# Patient Record
Sex: Male | Born: 1995 | Race: Black or African American | Hispanic: No | Marital: Single
Health system: Southern US, Community
[De-identification: ages and names within clinical notes are randomized; demographics above are authoritative.]

## PROBLEM LIST (undated history)

## (undated) DIAGNOSIS — J302 Other seasonal allergic rhinitis: Secondary | ICD-10-CM

## (undated) HISTORY — DX: Other seasonal allergic rhinitis: J30.2

---

## 2016-05-17 ENCOUNTER — Ambulatory Visit: Payer: Self-pay | Attending: Internal Medicine

## 2016-06-26 ENCOUNTER — Encounter (HOSPITAL_COMMUNITY): Payer: Self-pay

## 2016-06-26 ENCOUNTER — Emergency Department (HOSPITAL_COMMUNITY)
Admission: EM | Admit: 2016-06-26 | Discharge: 2016-06-26 | Disposition: A | Discharge status: Home / Self Care | Payer: Self-pay | Attending: Emergency Medicine | Admitting: Emergency Medicine

## 2016-06-26 ENCOUNTER — Emergency Department (HOSPITAL_COMMUNITY): Payer: Self-pay

## 2016-06-26 DIAGNOSIS — Y999 Unspecified external cause status: Secondary | ICD-10-CM | POA: Insufficient documentation

## 2016-06-26 DIAGNOSIS — S60221A Contusion of right hand, initial encounter: Secondary | ICD-10-CM | POA: Insufficient documentation

## 2016-06-26 DIAGNOSIS — Y9389 Activity, other specified: Secondary | ICD-10-CM | POA: Insufficient documentation

## 2016-06-26 DIAGNOSIS — W2201XA Walked into wall, initial encounter: Secondary | ICD-10-CM | POA: Insufficient documentation

## 2016-06-26 DIAGNOSIS — Y92009 Unspecified place in unspecified non-institutional (private) residence as the place of occurrence of the external cause: Secondary | ICD-10-CM | POA: Insufficient documentation

## 2016-06-26 MED ORDER — IBUPROFEN 800 MG PO TABS: 800.0000 mg | ORAL_TABLET | Freq: Three times a day (TID) | ORAL | Status: DC

## 2016-06-26 MED ORDER — IBUPROFEN 800 MG PO TABS
800.0000 mg | ORAL_TABLET | Freq: Once | ORAL | Status: AC
  Administered 2016-06-26: 800 mg via ORAL
  Filled 2016-06-26: qty 1

## 2016-06-26 NOTE — Discharge Instructions (Signed)
1. Medications: alternate ibuprofen and tylenol for pain control, usual home medications 2. Treatment: rest, ice, elevate and use brace, drink plenty of fluids, gentle stretching 3. Follow Up: Please followup with orthopedics as directed or your PCP in 1 week if no improvement for discussion of your diagnoses and further evaluation after today's visit; if you do not have a primary care doctor use the resource guide provided to find one; Please return to the ER for worsening symptoms or other concerns    Cryotherapy Cryotherapy means treatment with cold. Ice or gel packs can be used to reduce both pain and swelling. Ice is the most helpful within the first 24 to 48 hours after an injury or flare-up from overusing a muscle or joint. Sprains, strains, spasms, burning pain, shooting pain, and aches can all be eased with ice. Ice can also be used when recovering from surgery. Ice is effective, has very few side effects, and is safe for most people to use. PRECAUTIONS  Ice is not a safe treatment option for people with:  Raynaud phenomenon. This is a condition affecting small blood vessels in the extremities. Exposure to cold may cause your problems to return.  Cold hypersensitivity. There are many forms of cold hypersensitivity, including:  Cold urticaria. Red, itchy hives appear on the skin when the tissues begin to warm after being iced.  Cold erythema. This is a red, itchy rash caused by exposure to cold.  Cold hemoglobinuria. Red blood cells break down when the tissues begin to warm after being iced. The hemoglobin that carry oxygen are passed into the urine because they cannot combine with blood proteins fast enough.  Numbness or altered sensitivity in the area being iced. If you have any of the following conditions, do not use ice until you have discussed cryotherapy with your caregiver:  Heart conditions, such as arrhythmia, angina, or chronic heart disease.  High blood  pressure.  Healing wounds or open skin in the area being iced.  Current infections.  Rheumatoid arthritis.  Poor circulation.  Diabetes. Ice slows the blood flow in the region it is applied. This is beneficial when trying to stop inflamed tissues from spreading irritating chemicals to surrounding tissues. However, if you expose your skin to cold temperatures for too long or without the proper protection, you can damage your skin or nerves. Watch for signs of skin damage due to cold. HOME CARE INSTRUCTIONS Follow these tips to use ice and cold packs safely.  Place a dry or damp towel between the ice and skin. A damp towel will cool the skin more quickly, so you may need to shorten the time that the ice is used.  For a more rapid response, add gentle compression to the ice.  Ice for no more than 10 to 20 minutes at a time. The bonier the area you are icing, the less time it will take to get the benefits of ice.  Check your skin after 5 minutes to make sure there are no signs of a poor response to cold or skin damage.  Rest 20 minutes or more between uses.  Once your skin is numb, you can end your treatment. You can test numbness by very lightly touching your skin. The touch should be so light that you do not see the skin dimple from the pressure of your fingertip. When using ice, most people will feel these normal sensations in this order: cold, burning, aching, and numbness.  Do not use ice on someone who  cannot communicate their responses to pain, such as small children or people with dementia. HOW TO MAKE AN ICE PACK Ice packs are the most common way to use ice therapy. Other methods include ice massage, ice baths, and cryosprays. Muscle creams that cause a cold, tingly feeling do not offer the same benefits that ice offers and should not be used as a substitute unless recommended by your caregiver. To make an ice pack, do one of the following:  Place crushed ice or a bag of frozen  vegetables in a sealable plastic bag. Squeeze out the excess air. Place this bag inside another plastic bag. Slide the bag into a pillowcase or place a damp towel between your skin and the bag.  Mix 3 parts water with 1 part rubbing alcohol. Freeze the mixture in a sealable plastic bag. When you remove the mixture from the freezer, it will be slushy. Squeeze out the excess air. Place this bag inside another plastic bag. Slide the bag into a pillowcase or place a damp towel between your skin and the bag. SEEK MEDICAL CARE IF:  You develop white spots on your skin. This may give the skin a blotchy (mottled) appearance.  Your skin turns blue or pale.  Your skin becomes waxy or hard.  Your swelling gets worse. MAKE SURE YOU:   Understand these instructions.  Will watch your condition.  Will get help right away if you are not doing well or get worse.   This information is not intended to replace advice given to you by your health care provider. Make sure you discuss any questions you have with your health care provider.   Document Released: 07/25/2011 Document Revised: 12/19/2014 Document Reviewed: 07/25/2011 Elsevier Interactive Patient Education 2016 Elsevier Inc.    Hand Contusion A hand contusion is a deep bruise on your hand area. Contusions are the result of an injury that caused bleeding under the skin. The contusion may turn blue, purple, or yellow. Minor injuries will give you a painless contusion, but more severe contusions may stay painful and swollen for a few weeks. CAUSES  A contusion is usually caused by a blow, trauma, or direct force to an area of the body. SYMPTOMS   Swelling and redness of the injured area.  Discoloration of the injured area.  Tenderness and soreness of the injured area.  Pain. DIAGNOSIS  The diagnosis can be made by taking a history and performing a physical exam. An X-ray, CT scan, or MRI may be needed to determine if there were any  associated injuries, such as broken bones (fractures). TREATMENT  Often, the best treatment for a hand contusion is resting, elevating, icing, and applying cold compresses to the injured area. Over-the-counter medicines may also be recommended for pain control. HOME CARE INSTRUCTIONS   Put ice on the injured area.  Put ice in a plastic bag.  Place a towel between your skin and the bag.  Leave the ice on for 15-20 minutes, 03-04 times a day.  Only take over-the-counter or prescription medicines as directed by your caregiver. Your caregiver may recommend avoiding anti-inflammatory medicines (aspirin, ibuprofen, and naproxen) for 48 hours because these medicines may increase bruising.  If told, use an elastic wrap as directed. This can help reduce swelling. You may remove the wrap for sleeping, showering, and bathing. If your fingers become numb, cold, or blue, take the wrap off and reapply it more loosely.  Elevate your hand with pillows to reduce swelling.  Avoid overusing  your hand if it is painful. SEEK IMMEDIATE MEDICAL CARE IF:   You have increased redness, swelling, or pain in your hand.  Your swelling or pain is not relieved with medicines.  You have loss of feeling in your hand or are unable to move your fingers.  Your hand turns cold or blue.  You have pain when you move your fingers.  Your hand becomes warm to the touch.  Your contusion does not improve in 2 days. MAKE SURE YOU:   Understand these instructions.  Will watch your condition.  Will get help right away if you are not doing well or get worse.   This information is not intended to replace advice given to you by your health care provider. Make sure you discuss any questions you have with your health care provider.   Document Released: 05/20/2002 Document Revised: 08/22/2012 Document Reviewed: 05/21/2012 Elsevier Interactive Patient Education [employer]2016 Elsevier Inc.

## 2016-06-26 NOTE — ED Provider Notes (Signed)
CSN: [id number]     Arrival date & time 06/26/16  0009 History  By signing my name below, I, Steven Norman, attest that this documentation has been prepared under the direction and in the presence of Steven Ellis Mehaffey Steven Norman.   Electronically Signed: Vista Minkobert Norman, ED Scribe. 06/26/2016. 1:16 AM.   Chief Complaint  Patient presents with  . Hand Injury    HPI HPI Comments: Steven Norman is a 20 y.o. male who presents to the Emergency Department complaining of an injury to his right hand that occurred around noon yesterday. Pt reports constant, unchanged, throbbing pain to the hand. Pt states he punched a drywall inside of a house. Pt reports pain exacerbated by making a fist with the hand. Pt has not taken any medication for pain but has applied ice with minor relief. Pt denies any numbness or tingling in the hand. Pt further denies any pain to his right elbow.  Pt denies weakness or dropping things from the hand.  History reviewed. No pertinent past medical history. History reviewed. No pertinent past surgical history. History reviewed. No pertinent family history. Social History  Substance Use Topics  . Smoking status: Never Smoker   . Smokeless tobacco: None  . Alcohol Use: No    Review of Systems  Constitutional: Negative for fever and chills.  Gastrointestinal: Negative for nausea and vomiting.  Musculoskeletal: Positive for joint swelling and arthralgias (right hand). Negative for back pain, neck pain and neck stiffness.  Skin: Negative for wound.  Neurological: Negative for numbness.  Hematological: Does not bruise/bleed easily.  Psychiatric/Behavioral: The patient is not nervous/anxious.   All other systems reviewed and are negative.     Allergies  Review of patient's allergies indicates no known allergies.  Home Medications   Prior to Admission medications   Medication Sig Start Date End Date Taking? Authorizing Provider  ibuprofen (ADVIL,MOTRIN) 800 MG tablet Take 1  tablet (800 mg total) by mouth 3 (three) times daily. 06/26/16   Steven Siegel, Steven Norman   BP 120/63 mmHg  Pulse 81  Temp(Src) 97.6 F (36.4 C) (Oral)  Resp 18  SpO2 100% Physical Exam  Constitutional: He appears well-developed and well-nourished. No distress.  HENT:  Head: Normocephalic and atraumatic.  Eyes: Conjunctivae are normal.  Neck: Normal range of motion.  Cardiovascular: Normal rate, regular rhythm and intact distal pulses.   Capillary refill < 3 sec  Pulmonary/Chest: Effort normal and breath sounds normal.  Musculoskeletal: He exhibits tenderness. He exhibits no edema.  Right hand: Ecchymosis and mild swelling to the dorsum of the right hand. Somewhat limited range of motion of all fingers of the right hand and right wrist due to pain and poor effort. Sensation intact all fingers and entirety of the hand. Grip strength 4/5 due to pain and poor effort.  Full range of motion of the right elbow  Neurological: He is alert. Coordination normal.  Skin: Skin is warm and dry. No rash noted. He is not diaphoretic.  No tenting of the skin  Psychiatric: He has a normal mood and affect.  Nursing note and vitals reviewed.   ED Course  Procedures  DIAGNOSTIC STUDIES: Oxygen Saturation is 100% on RA, normal by my interpretation.  COORDINATION OF CARE: 1:16 AM-Will order imaging. Discussed treatment plan with pt at bedside and pt agreed to plan.    Imaging Review Dg Hand Complete Right  06/26/2016  CLINICAL DATA:  20 year old male with right hand pain. EXAM: RIGHT HAND - COMPLETE 3+ VIEW COMPARISON:  None. FINDINGS: There is no evidence of fracture or dislocation. There is no evidence of arthropathy or other focal bone abnormality. Soft tissues are unremarkable. IMPRESSION: Negative. Electronically Signed   By: Elgie Collard M.D.   On: 06/26/2016 00:41   I have personally reviewed and evaluated these images and lab results as part of my medical decision-making.    MDM    Final diagnoses:  Contusion of right hand, initial encounter  Steven Norman presents with right hand pain after punching a wall yesterday.  Patient X-Ray negative for obvious fracture or dislocation. Pain managed in ED. Pt advised to follow up with PCP if symptoms persist. Patient given brace while in ED, conservative therapy recommended and discussed. Patient will be dc home & is agreeable with above plan.  I personally performed the services described in this documentation, which was scribed in my presence. The recorded information has been reviewed and is accurate.   Dahlia Client Kassondra Geil, Steven Norman 06/26/16 0155  Lyndal Pulley, MD 06/26/16 [id number]

## 2016-06-26 NOTE — ED Notes (Signed)
Pt ambulated to XR. Declined ice pack.

## 2016-06-26 NOTE — ED Notes (Addendum)
Pt c/o R hand pain. Pt states that he punched a wall yesterday. Normal color. R hand is swollen and pt is unable to make a fist. A&Ox4.

## 2016-06-27 MED FILL — IBUPROFEN 800 MG TABLET: 800 | 7 days supply | Qty: 21 | Fill #0

## 2017-08-23 ENCOUNTER — Ambulatory Visit: Payer: Self-pay | Attending: Family Medicine | Admitting: Licensed Clinical Social Worker

## 2017-08-23 ENCOUNTER — Encounter: Payer: Self-pay | Admitting: Family Medicine

## 2017-08-23 ENCOUNTER — Ambulatory Visit: Payer: Self-pay | Attending: Family Medicine | Admitting: Family Medicine

## 2017-08-23 ENCOUNTER — Other Ambulatory Visit: Payer: Self-pay

## 2017-08-23 VITALS — BP 101/63 | HR 54 | Temp 98.6°F | Resp 18 | Ht 68.0 in | Wt 124.6 lb

## 2017-08-23 DIAGNOSIS — F4321 Adjustment disorder with depressed mood: Secondary | ICD-10-CM

## 2017-08-23 DIAGNOSIS — Z634 Disappearance and death of family member: Secondary | ICD-10-CM

## 2017-08-23 DIAGNOSIS — I491 Atrial premature depolarization: Secondary | ICD-10-CM

## 2017-08-23 DIAGNOSIS — R011 Cardiac murmur, unspecified: Secondary | ICD-10-CM | POA: Insufficient documentation

## 2017-08-23 DIAGNOSIS — R9431 Abnormal electrocardiogram [ECG] [EKG]: Secondary | ICD-10-CM

## 2017-08-23 DIAGNOSIS — F4329 Adjustment disorder with other symptoms: Secondary | ICD-10-CM

## 2017-08-23 DIAGNOSIS — Z0000 Encounter for general adult medical examination without abnormal findings: Secondary | ICD-10-CM

## 2017-08-23 DIAGNOSIS — F4323 Adjustment disorder with mixed anxiety and depressed mood: Secondary | ICD-10-CM

## 2017-08-23 MED ORDER — CITALOPRAM HYDROBROMIDE 10 MG PO TABS: 10.0000 mg | ORAL_TABLET | Freq: Every day | ORAL | 1 refills | Status: AC

## 2017-08-23 NOTE — Patient Instructions (Signed)

## 2017-08-23 NOTE — Progress Notes (Signed)
Subjective:   Patient ID: Steven Norman, male    DOB: [birth year], 21 y.o.   MRN: [id number]  Chief Complaint  Patient presents with  . Establish Care   HPI Steven Norman 21 y.o. male presents for comprehensive physical examination. Patient denies chest pain, chest pressure/discomfort, claudication, dyspnea, fatigue, lower extremity edema, near-syncope, palpitations and syncope.   He denies hematemesis, melena and unexpected weight loss.He complains of depressed mood, difficulty concentrating and excessive worry. Onset was approximately 7 months ago, unchanged since that time.  He denies current suicidal and homicidal plan or intent.   Possible organic causes contributing are: none.  Risk factors: negative life event reports his older brother died in a shooting  7 months ago. Previous treatment includes none . He is agreeable to speaking with LCSW at this time, he reports interest in medication to help manage symptoms.   Past Medical History:  Diagnosis Date  . Seasonal allergies     History reviewed. No pertinent surgical history.  Family History  Problem Relation Age of Onset  . Cancer Maternal Aunt     Social History   Social History  . Marital status: Single    Spouse name: N/A  . Number of children: N/A  . Years of education: N/A   Occupational History  . Not on file.   Social History Main Topics  . Smoking status: Never Smoker  . Smokeless tobacco: Never Used  . Alcohol use No  . Drug use: Unknown  . Sexual activity: Not on file   Other Topics Concern  . Not on file   Social History Narrative  . No narrative on file    Outpatient Medications Prior to Visit  Medication Sig Dispense Refill  . ibuprofen (ADVIL,MOTRIN) 800 MG tablet Take 1 tablet (800 mg total) by mouth 3 (three) times daily. (Patient not taking: Reported on 08/23/2017) 21 tablet 0   No facility-administered medications prior to visit.     No Known Allergies  Review of Systems    Constitutional: Negative.   HENT: Negative.   Eyes: Negative.   Respiratory: Negative.   Cardiovascular: Negative.   Gastrointestinal: Negative.   Genitourinary: Negative.   Musculoskeletal: Negative.   Skin: Negative.   Neurological: Negative.   Endo/Heme/Allergies: Negative.   Psychiatric/Behavioral: Positive for depression. Negative for memory loss. The patient is nervous/anxious.        Objective:    Physical Exam  Constitutional: He is oriented to person, place, and time. He appears well-developed and well-nourished.  HENT:  Head: Normocephalic and atraumatic.  Right Ear: External ear normal.  Left Ear: External ear normal.  Nose: Nose normal.  Mouth/Throat: Oropharynx is clear and moist.  Eyes: Pupils are equal, round, and reactive to light. Conjunctivae and EOM are normal.  Neck: Normal range of motion. Neck supple.  Cardiovascular: Normal rate, regular rhythm and intact distal pulses.   Murmur heard. Pulmonary/Chest: Effort normal and breath sounds normal.  Abdominal: Soft. Bowel sounds are normal.  Musculoskeletal: Normal range of motion.  Neurological: He is alert and oriented to person, place, and time. He has normal reflexes. He displays a negative Romberg sign. Gait normal.  Skin: Skin is warm and dry.  Psychiatric: He has a normal mood and affect.  Nursing note and vitals reviewed.   BP 101/63 (BP Location: Left Arm, Patient Position: Sitting, Cuff Size: Normal)   Pulse (!) 54   Temp 98.6 F (37 C) (Oral)   Resp 18   Ht  (1.727 m)  Wt 124 lb 9.6 oz (56.5 kg)   SpO2 97%   BMI 18.95 kg/m  Wt Readings from Last 3 Encounters:  08/23/17 124 lb 9.6 oz (56.5 kg)   Lab Results  Component Value Date   TSH 3.380 08/23/2017   Lab Results  Component Value Date   WBC 6.5 08/23/2017   HGB 14.7 08/23/2017   HCT 45.4 08/23/2017   MCV 102 (H) 08/23/2017   PLT 239 08/23/2017   Lab Results  Component Value Date   NA 141 08/23/2017   K 4.1  08/23/2017   CO2 24 08/23/2017   GLUCOSE 91 08/23/2017   BUN 11 08/23/2017   CREATININE 0.93 08/23/2017   BILITOT 0.4 08/23/2017   ALKPHOS 55 08/23/2017   AST 14 08/23/2017   ALT 6 08/23/2017   PROT 7.0 08/23/2017   ALBUMIN 4.4 08/23/2017   CALCIUM 9.3 08/23/2017   Lab Results  Component Value Date   CHOL 150 08/23/2017   Lab Results  Component Value Date   HDL 52 08/23/2017   Lab Results  Component Value Date   LDLCALC 81 08/23/2017   Lab Results  Component Value Date   TRIG 87 08/23/2017   Lab Results  Component Value Date   CHOLHDL 2.9 08/23/2017   Lab Results  Component Value Date   HGBA1C 4.6 (L) 08/23/2017       Assessment & Plan:   1. Annual physical exam  - CMP and Liver - CBC with Differential - Lipid Panel - TSH - Vitamin D, 25-hydroxy - Hemoglobin A1c  2. Murmur, cardiac Mumur heard upon assessment. EKG performed and showed pac's w/ bigeminy. - EKG 12-Lead - ECHOCARDIOGRAM COMPLETE; Future - Ambulatory referral to Cardiology  3. Complicated grief Follow up with LCSW in 4 weeks. Follow up with PCP in 8 weeks. - citalopram (CELEXA) 10 MG tablet; Take 1 tablet (10 mg total) by mouth daily.  Dispense: 30 tablet; Refill: 1   4. Abnormal ECG  - EKG 12-Lead - ECHOCARDIOGRAM COMPLETE; Future - Ambulatory referral to Cardiology  5. Premature atrial complexes  - EKG 12-Lead - ECHOCARDIOGRAM COMPLETE; Future - Ambulatory referral to Cardiology  6. Adjustment disorder with mixed anxiety and depressed mood Follow up with LCSW in 4 weeks. Follow up with PCP in 8 weeks. - citalopram (CELEXA) 10 MG tablet; Take 1 tablet (10 mg total) by mouth daily.  Dispense: 30 tablet; Refill: 1      Meds ordered this encounter  Medications  . citalopram (CELEXA) 10 MG tablet    Sig: Take 1 tablet (10 mg total) by mouth daily.    Dispense:  30 tablet    Refill:  1    Order Specific Question:   Supervising Provider    Answer:   Steven Norman, Steven Norman  Steven Norman[[id number]]    Follow up: Return in about 4 weeks (around 09/20/2017) for Adjustment Disorder w/ Jasmine.   Steven SenateMandesia Lasya Vetter, FNP

## 2017-08-23 NOTE — Progress Notes (Signed)
Patient is here for physical.

## 2017-08-24 LAB — HEMOGLOBIN A1C
ESTIMATED AVERAGE GLUCOSE: 85 mg/dL
Hgb A1c MFr Bld: 4.6 % — ABNORMAL LOW (ref 4.8–5.6)

## 2017-08-24 LAB — CBC WITH DIFFERENTIAL/PLATELET
BASOS: 0 %
Basophils Absolute: 0 10*3/uL (ref 0.0–0.2)
EOS (ABSOLUTE): 0.1 10*3/uL (ref 0.0–0.4)
Eos: 2 %
Hematocrit: 45.4 % (ref 37.5–51.0)
Hemoglobin: 14.7 g/dL (ref 13.0–17.7)
Immature Grans (Abs): 0 10*3/uL (ref 0.0–0.1)
Immature Granulocytes: 0 %
Lymphocytes Absolute: 2.2 10*3/uL (ref 0.7–3.1)
Lymphs: 33 %
MCH: 33.1 pg — AB (ref 26.6–33.0)
MCHC: 32.4 g/dL (ref 31.5–35.7)
MCV: 102 fL — AB (ref 79–97)
MONOS ABS: 0.5 10*3/uL (ref 0.1–0.9)
Monocytes: 8 %
NEUTROS ABS: 3.7 10*3/uL (ref 1.4–7.0)
Neutrophils: 57 %
PLATELETS: 239 10*3/uL (ref 150–379)
RBC: 4.44 x10E6/uL (ref 4.14–5.80)
RDW: 12.9 % (ref 12.3–15.4)
WBC: 6.5 10*3/uL (ref 3.4–10.8)

## 2017-08-24 LAB — CMP AND LIVER
ALBUMIN: 4.4 g/dL (ref 3.5–5.5)
ALK PHOS: 55 IU/L (ref 39–117)
ALT: 6 IU/L (ref 0–44)
AST: 14 IU/L (ref 0–40)
BILIRUBIN TOTAL: 0.4 mg/dL (ref 0.0–1.2)
BILIRUBIN, DIRECT: 0.12 mg/dL (ref 0.00–0.40)
BUN: 11 mg/dL (ref 6–20)
CHLORIDE: 102 mmol/L (ref 96–106)
CO2: 24 mmol/L (ref 20–29)
Calcium: 9.3 mg/dL (ref 8.7–10.2)
Creatinine, Ser: 0.93 mg/dL (ref 0.76–1.27)
GFR, EST AFRICAN AMERICAN: 133 mL/min/{1.73_m2} (ref >59)
GFR, EST NON AFRICAN AMERICAN: 115 mL/min/{1.73_m2} (ref >59)
GLUCOSE: 91 mg/dL (ref 65–99)
POTASSIUM: 4.1 mmol/L (ref 3.5–5.2)
SODIUM: 141 mmol/L (ref 134–144)
Total Protein: 7 g/dL (ref 6.0–8.5)

## 2017-08-24 LAB — TSH: TSH: 3.38 u[IU]/mL (ref 0.450–4.500)

## 2017-08-24 LAB — LIPID PANEL
CHOL/HDL RATIO: 2.9 ratio (ref 0.0–5.0)
Cholesterol, Total: 150 mg/dL (ref 100–199)
HDL: 52 mg/dL (ref >39)
LDL Calculated: 81 mg/dL (ref 0–99)
Triglycerides: 87 mg/dL (ref 0–149)
VLDL CHOLESTEROL CAL: 17 mg/dL (ref 5–40)

## 2017-08-24 LAB — VITAMIN D 25 HYDROXY (VIT D DEFICIENCY, FRACTURES): VIT D 25 HYDROXY: 21.5 ng/mL — AB (ref 30.0–100.0)

## 2017-08-24 NOTE — BH Specialist Note (Signed)
Integrated Behavioral Health Initial Visit  MRN: [id number] Name: Steven Norman   Session Start time: 10:00 AM Session End time: 10:30 AM Total time: 30 minutes  Type of Service: Integrated Behavioral Health- Individual/Family Interpretor:No. Interpretor Name and Language: N/A   Warm Hand Off Completed.       SUBJECTIVE: Steven Norman is a 21 y.o. male accompanied by patient. [facility]. Patient reports the following symptoms/concerns: decreased appetite, racing thoughts, and irritability Duration of problem: 7 months ago after the death of older brother; Severity of problem: moderate  OBJECTIVE: Mood: Anxious and Depressed and Affect: Depressed Risk of harm to self or others: No plan to harm self or others   LIFE CONTEXT: Family and Social: Pt receives emotional support from family and friends School/Work: Pt is employed Self-Care: Pt smokes marijuana to assist with appetite and sleep concerns Life Changes: Pt is grieving the death of his older brother   GOALS ADDRESSED: Patient will reduce symptoms of: anxiety and depression and increase knowledge and/or ability of: coping skills and also: Increase adequate support systems for patient/family and Begin healthy grieving over loss   INTERVENTIONS: Solution-Focused Strategies, Supportive Counseling, Psychoeducation and/or Health Education and Link to [facility]  Standardized Assessments completed: GAD-7 and PHQ 2&9  ASSESSMENT: Patient currently experiencing depression and anxiety triggered by the recent loss of sibling. He reports depressed mood, decreased appetite, racing thoughts, and irritability. Denies SI/HI/AVH. Patient may benefit from psychoeducation, psychotherapy, and medication management. LCSWA educated pt on the stages of grief and encouraged pt to participate in grief counseling and support. Pt is not interested in initiating services at this time. He is open  to participating in mediation management through PCP. LCSWA provided crisis intervention and psychotherapy resources.   PLAN: 1. Follow up with behavioral health clinician on : Pt was encouraged to contact LCSWA if symptoms worsen or fail to improve to [facility]. 2. Behavioral recommendations: LCSWA recommends that pt apply healthy coping skills discussed, comply with medication management, and utilize provided resources. Pt is encouraged to schedule follow up appointment with LCSWA 3. Referral(s): Integrated Art gallery managerBehavioral Health Services (In Clinic) and Community Mental Health Services (LME/Outside Clinic) 4. "From scale of 1-10, how likely are you to follow plan?": 7/10  Bridgett LarssonJasmine D Jemal Miskell, LCSW 08/24/17 4:00 PM

## 2017-08-28 ENCOUNTER — Ambulatory Visit (HOSPITAL_COMMUNITY): Admission: RE | Admit: 2017-08-28 | Payer: Self-pay | Source: Ambulatory Visit

## 2017-08-29 ENCOUNTER — Other Ambulatory Visit: Payer: Self-pay | Admitting: Family Medicine

## 2017-08-29 DIAGNOSIS — E559 Vitamin D deficiency, unspecified: Secondary | ICD-10-CM

## 2017-08-29 MED ORDER — VITAMIN D (ERGOCALCIFEROL) 1.25 MG (50000 UNIT) PO CAPS: 50000.0000 [IU] | ORAL_CAPSULE | ORAL | 0 refills | Status: AC

## 2017-08-30 ENCOUNTER — Telehealth: Payer: Self-pay

## 2017-08-30 NOTE — Telephone Encounter (Signed)
-----   Message from Lizbeth Bark, [place] sent at 08/29/2017  4:07 PM EDT ----- Kidney function normal Liver function normal Labs that evaluated your blood cells, fluid and electrolyte balance are normal. No signs of anemia, acute infection, or inflammation present. Cholesterol levels normal.  Thyroid function normal Diabetes screening negative for diabetes.  Vitamin D level was low. Vitamin D helps to keep bones strong. You were prescribed ergocalciferol (capsules) to increase your vitamin-d level.

## 2017-08-30 NOTE — Telephone Encounter (Signed)
CMA call regarding lab results  Patient did not answer & unable to leave message  

## 2018-08-10 ENCOUNTER — Ambulatory Visit: Payer: Self-pay | Admitting: Nurse Practitioner

## 2019-08-26 ENCOUNTER — Inpatient Hospital Stay (HOSPITAL_COMMUNITY): Payer: Self-pay | Admitting: Certified Registered Nurse Anesthetist

## 2019-08-26 ENCOUNTER — Encounter (HOSPITAL_COMMUNITY): Payer: Self-pay | Admitting: Neurological Surgery

## 2019-08-26 ENCOUNTER — Encounter (HOSPITAL_COMMUNITY): Admission: EM | Disposition: A | Discharge status: Home / Self Care | Payer: Self-pay | Source: Home / Self Care

## 2019-08-26 ENCOUNTER — Inpatient Hospital Stay (HOSPITAL_COMMUNITY)
Admission: EM | Admit: 2019-08-26 | Discharge: 2019-09-01 | DRG: 957 | Disposition: A | Discharge status: Home / Self Care | Payer: Self-pay | Attending: General Surgery | Admitting: General Surgery

## 2019-08-26 ENCOUNTER — Emergency Department (HOSPITAL_COMMUNITY): Payer: Self-pay

## 2019-08-26 DIAGNOSIS — Z781 Physical restraint status: Secondary | ICD-10-CM

## 2019-08-26 DIAGNOSIS — J9690 Respiratory failure, unspecified, unspecified whether with hypoxia or hypercapnia: Secondary | ICD-10-CM

## 2019-08-26 DIAGNOSIS — S066X9A Traumatic subarachnoid hemorrhage with loss of consciousness of unspecified duration, initial encounter: Secondary | ICD-10-CM | POA: Diagnosis present

## 2019-08-26 DIAGNOSIS — R402352 Coma scale, best motor response, localizes pain, at arrival to emergency department: Secondary | ICD-10-CM | POA: Diagnosis present

## 2019-08-26 DIAGNOSIS — R402142 Coma scale, eyes open, spontaneous, at arrival to emergency department: Secondary | ICD-10-CM | POA: Diagnosis present

## 2019-08-26 DIAGNOSIS — R413 Other amnesia: Secondary | ICD-10-CM | POA: Diagnosis present

## 2019-08-26 DIAGNOSIS — Y92481 Parking lot as the place of occurrence of the external cause: Secondary | ICD-10-CM

## 2019-08-26 DIAGNOSIS — H73891 Other specified disorders of tympanic membrane, right ear: Secondary | ICD-10-CM | POA: Diagnosis present

## 2019-08-26 DIAGNOSIS — S069X0A Unspecified intracranial injury without loss of consciousness, initial encounter: Secondary | ICD-10-CM

## 2019-08-26 DIAGNOSIS — S065X9A Traumatic subdural hemorrhage with loss of consciousness of unspecified duration, initial encounter: Secondary | ICD-10-CM | POA: Diagnosis present

## 2019-08-26 DIAGNOSIS — S31633A Puncture wound without foreign body of abdominal wall, right lower quadrant with penetration into peritoneal cavity, initial encounter: Secondary | ICD-10-CM | POA: Diagnosis present

## 2019-08-26 DIAGNOSIS — W3400XA Accidental discharge from unspecified firearms or gun, initial encounter: Secondary | ICD-10-CM

## 2019-08-26 DIAGNOSIS — J9601 Acute respiratory failure with hypoxia: Secondary | ICD-10-CM | POA: Diagnosis present

## 2019-08-26 DIAGNOSIS — Z20828 Contact with and (suspected) exposure to other viral communicable diseases: Secondary | ICD-10-CM | POA: Diagnosis present

## 2019-08-26 DIAGNOSIS — R402242 Coma scale, best verbal response, confused conversation, at arrival to emergency department: Secondary | ICD-10-CM | POA: Diagnosis present

## 2019-08-26 DIAGNOSIS — R4587 Impulsiveness: Secondary | ICD-10-CM

## 2019-08-26 DIAGNOSIS — S36590A Other injury of ascending [right] colon, initial encounter: Principal | ICD-10-CM | POA: Diagnosis present

## 2019-08-26 DIAGNOSIS — S0219XA Other fracture of base of skull, initial encounter for closed fracture: Secondary | ICD-10-CM | POA: Diagnosis present

## 2019-08-26 DIAGNOSIS — R451 Restlessness and agitation: Secondary | ICD-10-CM | POA: Diagnosis present

## 2019-08-26 DIAGNOSIS — Z978 Presence of other specified devices: Secondary | ICD-10-CM

## 2019-08-26 HISTORY — PX: LAPAROTOMY: SHX154

## 2019-08-26 LAB — BPAM FFP
Blood Product Expiration Date: 202009142359
Blood Product Expiration Date: 202009142359
ISSUE DATE / TIME: 202009141314
ISSUE DATE / TIME: 202009141314
Unit Type and Rh: 6200
Unit Type and Rh: 6200

## 2019-08-26 LAB — PREPARE FRESH FROZEN PLASMA
Unit division: 0
Unit division: 0

## 2019-08-26 LAB — CBC
HCT: 45.6 % (ref 39.0–52.0)
Hemoglobin: 14.9 g/dL (ref 13.0–17.0)
MCH: 35.1 pg — ABNORMAL HIGH (ref 26.0–34.0)
MCHC: 32.7 g/dL (ref 30.0–36.0)
MCV: 107.5 fL — ABNORMAL HIGH (ref 80.0–100.0)
Platelets: 262 10*3/uL (ref 150–400)
RBC: 4.24 MIL/uL (ref 4.22–5.81)
RDW: 11.9 % (ref 11.5–15.5)
WBC: 10.8 10*3/uL — ABNORMAL HIGH (ref 4.0–10.5)
nRBC: 0 % (ref 0.0–0.2)

## 2019-08-26 LAB — TYPE AND SCREEN
ABO/RH(D): A POS
Antibody Screen: NEGATIVE
Unit division: 0
Unit division: 0

## 2019-08-26 LAB — RAPID URINE DRUG SCREEN, HOSP PERFORMED
Amphetamines: NOT DETECTED
Barbiturates: NOT DETECTED
Benzodiazepines: POSITIVE — AB
Cocaine: NOT DETECTED
Opiates: NOT DETECTED
Tetrahydrocannabinol: POSITIVE — AB

## 2019-08-26 LAB — POCT I-STAT 7, (LYTES, BLD GAS, ICA,H+H)
Acid-base deficit: 4 mmol/L — ABNORMAL HIGH (ref 0.0–2.0)
Bicarbonate: 19.2 mmol/L — ABNORMAL LOW (ref 20.0–28.0)
Calcium, Ion: 1.15 mmol/L (ref 1.15–1.40)
HCT: 41 % (ref 39.0–52.0)
Hemoglobin: 13.9 g/dL (ref 13.0–17.0)
O2 Saturation: 100 %
Patient temperature: 94.8
Potassium: 3.5 mmol/L (ref 3.5–5.1)
Sodium: 138 mmol/L (ref 135–145)
TCO2: 20 mmol/L — ABNORMAL LOW (ref 22–32)
pCO2 arterial: 26.8 mmHg — ABNORMAL LOW (ref 32.0–48.0)
pH, Arterial: 7.455 — ABNORMAL HIGH (ref 7.350–7.450)
pO2, Arterial: 325 mmHg — ABNORMAL HIGH (ref 83.0–108.0)

## 2019-08-26 LAB — MRSA PCR SCREENING: MRSA by PCR: NEGATIVE

## 2019-08-26 LAB — URINALYSIS, ROUTINE W REFLEX MICROSCOPIC
Bilirubin Urine: NEGATIVE
Glucose, UA: NEGATIVE mg/dL
Hgb urine dipstick: NEGATIVE
Ketones, ur: NEGATIVE mg/dL
Leukocytes,Ua: NEGATIVE
Nitrite: NEGATIVE
Protein, ur: NEGATIVE mg/dL
Specific Gravity, Urine: 1.03 (ref 1.005–1.030)
pH: 8 (ref 5.0–8.0)

## 2019-08-26 LAB — I-STAT CHEM 8, ED
BUN: 18 mg/dL (ref 6–20)
Calcium, Ion: 1.04 mmol/L — ABNORMAL LOW (ref 1.15–1.40)
Chloride: 108 mmol/L (ref 98–111)
Creatinine, Ser: 0.8 mg/dL (ref 0.61–1.24)
Glucose, Bld: 169 mg/dL — ABNORMAL HIGH (ref 70–99)
HCT: 47 % (ref 39.0–52.0)
Hemoglobin: 16 g/dL (ref 13.0–17.0)
Potassium: 3.7 mmol/L (ref 3.5–5.1)
Sodium: 139 mmol/L (ref 135–145)
TCO2: 23 mmol/L (ref 22–32)

## 2019-08-26 LAB — COMPREHENSIVE METABOLIC PANEL
ALT: 14 U/L (ref 0–44)
AST: 29 U/L (ref 15–41)
Albumin: 4.4 g/dL (ref 3.5–5.0)
Alkaline Phosphatase: 41 U/L (ref 38–126)
Anion gap: 10 (ref 5–15)
BUN: 14 mg/dL (ref 6–20)
CO2: 23 mmol/L (ref 22–32)
Calcium: 8.9 mg/dL (ref 8.9–10.3)
Chloride: 106 mmol/L (ref 98–111)
Creatinine, Ser: 0.96 mg/dL (ref 0.61–1.24)
GFR calc Af Amer: >60 mL/min (ref >60)
GFR calc non Af Amer: >60 mL/min (ref >60)
Glucose, Bld: 172 mg/dL — ABNORMAL HIGH (ref 70–99)
Potassium: 3.8 mmol/L (ref 3.5–5.1)
Sodium: 139 mmol/L (ref 135–145)
Total Bilirubin: 1.4 mg/dL — ABNORMAL HIGH (ref 0.3–1.2)
Total Protein: 7.2 g/dL (ref 6.5–8.1)

## 2019-08-26 LAB — SARS CORONAVIRUS 2 BY RT PCR (HOSPITAL ORDER, PERFORMED IN ~~LOC~~ HOSPITAL LAB): SARS Coronavirus 2: NEGATIVE

## 2019-08-26 LAB — ETHANOL: Alcohol, Ethyl (B): <10 mg/dL (ref <10)

## 2019-08-26 LAB — PROTIME-INR
INR: 1.1 (ref 0.8–1.2)
Prothrombin Time: 13.7 seconds (ref 11.4–15.2)

## 2019-08-26 LAB — BPAM RBC
Blood Product Expiration Date: 202010152359
Blood Product Expiration Date: 202010152359
ISSUE DATE / TIME: 202009141652
ISSUE DATE / TIME: 202009141711
Unit Type and Rh: 5100
Unit Type and Rh: 5100

## 2019-08-26 LAB — ABO/RH: ABO/RH(D): A POS

## 2019-08-26 LAB — LACTIC ACID, PLASMA
Lactic Acid, Venous: 2.3 mmol/L (ref 0.5–1.9)
Lactic Acid, Venous: 1.6 mmol/L (ref 0.5–1.9)

## 2019-08-26 LAB — CDS SEROLOGY

## 2019-08-26 LAB — TRIGLYCERIDES: Triglycerides: 41 mg/dL (ref <150)

## 2019-08-26 SURGERY — LAPAROTOMY, EXPLORATORY
Anesthesia: General | Site: Abdomen

## 2019-08-26 MED ORDER — POTASSIUM CHLORIDE IN NACL 40-0.9 MEQ/L-% IV SOLN
INTRAVENOUS | Status: DC
  Administered 2019-08-26 – 2019-08-27 (×2): 125 mL/h via INTRAVENOUS
  Filled 2019-08-26 (×3): qty 1000

## 2019-08-26 MED ORDER — PANTOPRAZOLE SODIUM 40 MG PO TBEC
40.0000 mg | DELAYED_RELEASE_TABLET | Freq: Every day | ORAL | Status: DC
  Filled 2019-08-26: qty 1

## 2019-08-26 MED ORDER — IOHEXOL 300 MG/ML  SOLN
100.0000 mL | Freq: Once | INTRAMUSCULAR | Status: AC | PRN
  Administered 2019-08-26: 14:00:00 100 mL via INTRAVENOUS

## 2019-08-26 MED ORDER — ORAL CARE MOUTH RINSE
15.0000 mL | OROMUCOSAL | Status: DC
  Administered 2019-08-27 – 2019-08-28 (×6): 15 mL via OROMUCOSAL

## 2019-08-26 MED ORDER — LACTATED RINGERS IV SOLN
INTRAVENOUS | Status: DC | PRN
  Administered 2019-08-26 (×2): via INTRAVENOUS

## 2019-08-26 MED ORDER — PROPOFOL 1000 MG/100ML IV EMUL
INTRAVENOUS | Status: AC
  Filled 2019-08-26: qty 100

## 2019-08-26 MED ORDER — MIDAZOLAM HCL 2 MG/2ML IJ SOLN
INTRAMUSCULAR | Status: AC
  Filled 2019-08-26: qty 2

## 2019-08-26 MED ORDER — CHLORHEXIDINE GLUCONATE CLOTH 2 % EX PADS: 6.0000 | MEDICATED_PAD | Freq: Every day | CUTANEOUS | Status: DC

## 2019-08-26 MED ORDER — PROPOFOL 1000 MG/100ML IV EMUL
INTRAVENOUS | Status: AC | PRN
  Administered 2019-08-26: 40 ug/kg/min via INTRAVENOUS

## 2019-08-26 MED ORDER — LACTATED RINGERS IV SOLN
INTRAVENOUS | Status: DC | PRN
  Administered 2019-08-26 (×2): via INTRAVENOUS

## 2019-08-26 MED ORDER — FENTANYL CITRATE (PF) 250 MCG/5ML IJ SOLN
INTRAMUSCULAR | Status: AC
  Filled 2019-08-26: qty 5

## 2019-08-26 MED ORDER — ONDANSETRON 4 MG PO TBDP: 4.0000 mg | ORAL_TABLET | Freq: Four times a day (QID) | ORAL | Status: DC | PRN

## 2019-08-26 MED ORDER — CEFAZOLIN SODIUM-DEXTROSE 2-3 GM-%(50ML) IV SOLR
INTRAVENOUS | Status: DC | PRN
  Administered 2019-08-26: 2 g via INTRAVENOUS

## 2019-08-26 MED ORDER — HYDRALAZINE HCL 20 MG/ML IJ SOLN
10.0000 mg | INTRAMUSCULAR | Status: DC | PRN
  Administered 2019-08-26: 16:00:00 10 mg via INTRAVENOUS
  Filled 2019-08-26: qty 1

## 2019-08-26 MED ORDER — ONDANSETRON HCL 4 MG/2ML IJ SOLN: 4.0000 mg | Freq: Four times a day (QID) | INTRAMUSCULAR | Status: DC | PRN

## 2019-08-26 MED ORDER — ROCURONIUM BROMIDE 50 MG/5ML IV SOLN
INTRAVENOUS | Status: AC | PRN
  Administered 2019-08-26: 50 mg via INTRAVENOUS

## 2019-08-26 MED ORDER — ROCURONIUM BROMIDE 100 MG/10ML IV SOLN
INTRAVENOUS | Status: DC | PRN
  Administered 2019-08-26: 50 mg via INTRAVENOUS
  Administered 2019-08-26: 100 mg via INTRAVENOUS

## 2019-08-26 MED ORDER — FENTANYL CITRATE (PF) 100 MCG/2ML IJ SOLN: 100.0000 ug | INTRAMUSCULAR | Status: DC | PRN

## 2019-08-26 MED ORDER — PANTOPRAZOLE SODIUM 40 MG IV SOLR
40.0000 mg | Freq: Every day | INTRAVENOUS | Status: DC
  Administered 2019-08-27 – 2019-08-30 (×4): 40 mg via INTRAVENOUS
  Filled 2019-08-26 (×4): qty 40

## 2019-08-26 MED ORDER — METOPROLOL TARTRATE 5 MG/5ML IV SOLN: 5.0000 mg | Freq: Four times a day (QID) | INTRAVENOUS | Status: DC | PRN

## 2019-08-26 MED ORDER — PROPOFOL 10 MG/ML IV BOLUS
INTRAVENOUS | Status: AC
  Filled 2019-08-26: qty 20

## 2019-08-26 MED ORDER — FENTANYL 2500MCG IN NS 250ML (10MCG/ML) PREMIX INFUSION
0.0000 ug/h | INTRAVENOUS | Status: DC
  Administered 2019-08-26: 100 ug/h via INTRAVENOUS
  Filled 2019-08-26 (×2): qty 250

## 2019-08-26 MED ORDER — PROPOFOL 1000 MG/100ML IV EMUL
0.0000 ug/kg/min | INTRAVENOUS | Status: DC
  Administered 2019-08-26: 14:00:00 40 ug/kg/min via INTRAVENOUS

## 2019-08-26 MED ORDER — CHLORHEXIDINE GLUCONATE 0.12% ORAL RINSE (MEDLINE KIT)
15.0000 mL | Freq: Two times a day (BID) | OROMUCOSAL | Status: DC
  Administered 2019-08-26 – 2019-08-28 (×2): 15 mL via OROMUCOSAL

## 2019-08-26 MED ORDER — HEMOSTATIC AGENTS (NO CHARGE) OPTIME
TOPICAL | Status: DC | PRN
  Administered 2019-08-26 (×2): 1 via TOPICAL

## 2019-08-26 MED ORDER — FENTANYL CITRATE (PF) 100 MCG/2ML IJ SOLN
INTRAMUSCULAR | Status: DC | PRN
  Administered 2019-08-26 (×5): 50 ug via INTRAVENOUS

## 2019-08-26 MED ORDER — 0.9 % SODIUM CHLORIDE (POUR BTL) OPTIME
TOPICAL | Status: DC | PRN
  Administered 2019-08-26: 1000 mL

## 2019-08-26 MED ORDER — ETOMIDATE 2 MG/ML IV SOLN
INTRAVENOUS | Status: AC | PRN
  Administered 2019-08-26: 20 mg via INTRAVENOUS

## 2019-08-26 MED ORDER — SUCCINYLCHOLINE CHLORIDE 20 MG/ML IJ SOLN
INTRAMUSCULAR | Status: AC | PRN
  Administered 2019-08-26: 100 mg via INTRAVENOUS

## 2019-08-26 MED ORDER — MIDAZOLAM HCL 5 MG/5ML IJ SOLN
INTRAMUSCULAR | Status: DC | PRN
  Administered 2019-08-26 (×2): 2 mg via INTRAVENOUS

## 2019-08-26 MED ORDER — SODIUM CHLORIDE 0.9 % IV SOLN
INTRAVENOUS | Status: AC | PRN
  Administered 2019-08-26: 1000 mL via INTRAVENOUS

## 2019-08-26 MED ORDER — FENTANYL CITRATE (PF) 100 MCG/2ML IJ SOLN
100.0000 ug | INTRAMUSCULAR | Status: DC | PRN
  Administered 2019-08-26: 14:00:00 100 ug via INTRAVENOUS

## 2019-08-26 MED ORDER — DEXMEDETOMIDINE HCL IN NACL 400 MCG/100ML IV SOLN
0.4000 ug/kg/h | INTRAVENOUS | Status: DC
  Administered 2019-08-26: 0.4 ug/kg/h via INTRAVENOUS
  Filled 2019-08-26: qty 100

## 2019-08-26 MED ORDER — FENTANYL CITRATE (PF) 100 MCG/2ML IJ SOLN
INTRAMUSCULAR | Status: AC
  Filled 2019-08-26: qty 2

## 2019-08-26 MED ORDER — CHLORHEXIDINE GLUCONATE CLOTH 2 % EX PADS
6.0000 | MEDICATED_PAD | Freq: Every day | CUTANEOUS | Status: DC
  Administered 2019-08-27 – 2019-08-29 (×3): 6 via TOPICAL

## 2019-08-26 SURGICAL SUPPLY — 60 items
BLADE CLIPPER SURG (BLADE) IMPLANT
BNDG GAUZE ELAST 4 BULKY (GAUZE/BANDAGES/DRESSINGS) ×3 IMPLANT
CANISTER SUCT 3000ML PPV (MISCELLANEOUS) ×3 IMPLANT
CHLORAPREP W/TINT 26 (MISCELLANEOUS) ×3 IMPLANT
COVER SURGICAL LIGHT HANDLE (MISCELLANEOUS) ×6 IMPLANT
COVER WAND RF STERILE (DRAPES) ×3 IMPLANT
DRAPE LAPAROSCOPIC ABDOMINAL (DRAPES) ×3 IMPLANT
DRAPE WARM FLUID 44X44 (DRAPES) ×3 IMPLANT
DRSG OPSITE POSTOP 4X10 (GAUZE/BANDAGES/DRESSINGS) IMPLANT
DRSG OPSITE POSTOP 4X8 (GAUZE/BANDAGES/DRESSINGS) IMPLANT
DRSG PAD ABDOMINAL 8X10 ST (GAUZE/BANDAGES/DRESSINGS) ×3 IMPLANT
ELECT BLADE 6.5 EXT (BLADE) IMPLANT
ELECT CAUTERY BLADE 6.4 (BLADE) IMPLANT
ELECT REM PT RETURN 9FT ADLT (ELECTROSURGICAL) ×3
ELECTRODE REM PT RTRN 9FT ADLT (ELECTROSURGICAL) ×1 IMPLANT
GAUZE SPONGE 4X4 12PLY STRL (GAUZE/BANDAGES/DRESSINGS) ×3 IMPLANT
GLOVE BIO SURGEON STRL SZ 6.5 (GLOVE) ×2 IMPLANT
GLOVE BIO SURGEON STRL SZ7 (GLOVE) ×6 IMPLANT
GLOVE BIO SURGEONS STRL SZ 6.5 (GLOVE) ×1
GLOVE BIOGEL PI IND STRL 6.5 (GLOVE) ×1 IMPLANT
GLOVE BIOGEL PI IND STRL 7.0 (GLOVE) ×1 IMPLANT
GLOVE BIOGEL PI IND STRL 7.5 (GLOVE) ×2 IMPLANT
GLOVE BIOGEL PI INDICATOR 6.5 (GLOVE) ×2
GLOVE BIOGEL PI INDICATOR 7.0 (GLOVE) ×2
GLOVE BIOGEL PI INDICATOR 7.5 (GLOVE) ×4
GLOVE ECLIPSE 7.5 STRL STRAW (GLOVE) ×3 IMPLANT
GOWN STRL REUS W/ TWL LRG LVL3 (GOWN DISPOSABLE) ×8 IMPLANT
GOWN STRL REUS W/TWL LRG LVL3 (GOWN DISPOSABLE) ×16
HANDLE SUCTION POOLE (INSTRUMENTS) ×2 IMPLANT
HEMOSTAT SNOW SURGICEL 2X4 (HEMOSTASIS) ×2 IMPLANT
KIT BASIN OR (CUSTOM PROCEDURE TRAY) ×3 IMPLANT
KIT TURNOVER KIT B (KITS) ×3 IMPLANT
LIGASURE IMPACT 36 18CM CVD LR (INSTRUMENTS) ×3 IMPLANT
NS IRRIG 1000ML POUR BTL (IV SOLUTION) ×6 IMPLANT
PACK GENERAL/GYN (CUSTOM PROCEDURE TRAY) ×3 IMPLANT
PAD ARMBOARD 7.5X6 YLW CONV (MISCELLANEOUS) ×3 IMPLANT
PENCIL SMOKE EVACUATOR (MISCELLANEOUS) ×6 IMPLANT
RELOAD PROXIMATE 75MM BLUE (ENDOMECHANICALS) ×6 IMPLANT
SPECIMEN JAR LARGE (MISCELLANEOUS) ×3 IMPLANT
SPONGE LAP 18X18 RF (DISPOSABLE) ×3 IMPLANT
STAPLER GUN LINEAR PROX 60 (STAPLE) ×3 IMPLANT
STAPLER PROXIMATE 75MM BLUE (STAPLE) ×3 IMPLANT
STAPLER VISISTAT 35W (STAPLE) ×3 IMPLANT
SUCTION POOLE HANDLE (INSTRUMENTS) ×6
SURGICEL SNOW 2X4 (HEMOSTASIS) ×3 IMPLANT
SUT PDS AB 1 TP1 96 (SUTURE) ×12 IMPLANT
SUT SILK 2 0 (SUTURE) ×2
SUT SILK 2 0 SH CR/8 (SUTURE) ×6 IMPLANT
SUT SILK 2-0 18XBRD TIE 12 (SUTURE) ×1 IMPLANT
SUT SILK 3 0 (SUTURE) ×2
SUT SILK 3 0 SH CR/8 (SUTURE) ×3 IMPLANT
SUT SILK 3-0 18XBRD TIE 12 (SUTURE) ×1 IMPLANT
SUT VIC AB 2-0 SH 18 (SUTURE) ×6 IMPLANT
SUT VIC AB 3-0 SH 27 (SUTURE)
SUT VIC AB 3-0 SH 27X BRD (SUTURE) IMPLANT
SYR BULB IRRIGATION 50ML (SYRINGE) ×3 IMPLANT
TAPE CLOTH SURG 4X10 WHT LF (GAUZE/BANDAGES/DRESSINGS) ×3 IMPLANT
TOWEL GREEN STERILE (TOWEL DISPOSABLE) ×3 IMPLANT
TRAY FOLEY MTR SLVR 16FR STAT (SET/KITS/TRAYS/PACK) ×3 IMPLANT
YANKAUER SUCT BULB TIP NO VENT (SUCTIONS) ×3 IMPLANT

## 2019-08-26 NOTE — Progress Notes (Signed)
Pt transported from ED to OR on ventilator with no complications. Pt taken off vent upon arrival to OR. Pt's ventilator brought to 4N for his return post OR. Report given to receiving RT

## 2019-08-26 NOTE — Anesthesia Preprocedure Evaluation (Signed)
Anesthesia Evaluation  Patient identified by MRN, date of birth, ID band Patient unresponsive    Reviewed: Allergy & Precautions, H&P , NPO status , Patient's Chart, lab work & pertinent test results  Airway Mallampati: Intubated       Dental no notable dental hx. (+) Teeth Intact, Dental Advisory Given   Pulmonary neg pulmonary ROS,    Pulmonary exam normal breath sounds clear to auscultation       Cardiovascular negative cardio ROS   Rhythm:Regular Rate:Normal     Neuro/Psych negative neurological ROS  negative psych ROS   GI/Hepatic negative GI ROS, Neg liver ROS,   Endo/Other  negative endocrine ROS  Renal/GU negative Renal ROS  negative genitourinary   Musculoskeletal   Abdominal   Peds  Hematology negative hematology ROS (+)   Anesthesia Other Findings   Reproductive/Obstetrics negative OB ROS                             Anesthesia Physical Anesthesia Plan  ASA: III and emergent  Anesthesia Plan: General   Post-op Pain Management:    Induction: Intravenous  PONV Risk Score and Plan: 2 and Treatment may vary due to age or medical condition and Ondansetron  Airway Management Planned: Oral ETT  Additional Equipment:   Intra-op Plan:   Post-operative Plan: Post-operative intubation/ventilation  Informed Consent: I have reviewed the patients History and Physical, chart, labs and discussed the procedure including the risks, benefits and alternatives for the proposed anesthesia with the patient or authorized representative who has indicated his/her understanding and acceptance.     Dental advisory given  Plan Discussed with: CRNA  Anesthesia Plan Comments:         Anesthesia Quick Evaluation

## 2019-08-26 NOTE — ED Notes (Signed)
Successful intubation 
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
## 2019-08-26 NOTE — Anesthesia Procedure Notes (Signed)
Date/Time: 08/26/2019 2:43 PM Performed by: Elayne Snare, CRNA Pre-anesthesia Checklist: Patient identified, Emergency Drugs available, Suction available and Patient being monitored Oxygen Delivery Method: Circle system utilized Induction Type: Inhalational induction with existing ETT
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
## 2019-08-26 NOTE — ED Notes (Signed)
Pt taken to OR by Valeda Malm RN and RT
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
## 2019-08-26 NOTE — Procedures (Signed)
Intubation Procedure Note Steven Norman [id number] 02-Aug-1996  Procedure: Intubation Indications: Airway protection and maintenance  Procedure Details Consent: Unable to obtain consent because of emergent medical necessity. Time Out: Verified patient identification, verified procedure, site/side was marked, verified correct patient position, special equipment/implants available, medications/allergies/relevent history reviewed, required imaging and test results available.  Performed  Maximum sterile technique was used including gloves, gown, hand hygiene, mask and sheet.  MAC and 3    Evaluation Hemodynamic Status: BP stable throughout; O2 sats: stable throughout Patient's Current Condition: stable Complications: No apparent complications Patient did tolerate procedure well. Chest X-ray ordered to verify placement.  CXR: pending.   Jesse Sans 08/26/2019
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
## 2019-08-27 ENCOUNTER — Inpatient Hospital Stay (HOSPITAL_COMMUNITY): Payer: Self-pay

## 2019-08-27 ENCOUNTER — Encounter (HOSPITAL_COMMUNITY): Payer: Self-pay | Admitting: Emergency Medicine

## 2019-08-27 ENCOUNTER — Other Ambulatory Visit: Payer: Self-pay

## 2019-08-27 LAB — CBC
HCT: 43 % (ref 39.0–52.0)
Hemoglobin: 14.1 g/dL (ref 13.0–17.0)
MCH: 34.4 pg — ABNORMAL HIGH (ref 26.0–34.0)
MCHC: 32.8 g/dL (ref 30.0–36.0)
MCV: 104.9 fL — ABNORMAL HIGH (ref 80.0–100.0)
Platelets: 174 10*3/uL (ref 150–400)
RBC: 4.1 MIL/uL — ABNORMAL LOW (ref 4.22–5.81)
RDW: 12 % (ref 11.5–15.5)
WBC: 17.7 10*3/uL — ABNORMAL HIGH (ref 4.0–10.5)
nRBC: 0 % (ref 0.0–0.2)

## 2019-08-27 LAB — BASIC METABOLIC PANEL
Anion gap: 10 (ref 5–15)
BUN: 8 mg/dL (ref 6–20)
CO2: 17 mmol/L — ABNORMAL LOW (ref 22–32)
Calcium: 8.3 mg/dL — ABNORMAL LOW (ref 8.9–10.3)
Chloride: 108 mmol/L (ref 98–111)
Creatinine, Ser: 0.8 mg/dL (ref 0.61–1.24)
GFR calc Af Amer: >60 mL/min (ref >60)
GFR calc non Af Amer: >60 mL/min (ref >60)
Glucose, Bld: 100 mg/dL — ABNORMAL HIGH (ref 70–99)
Potassium: 4.5 mmol/L (ref 3.5–5.1)
Sodium: 135 mmol/L (ref 135–145)

## 2019-08-27 LAB — HIV ANTIBODY (ROUTINE TESTING W REFLEX): HIV Screen 4th Generation wRfx: NONREACTIVE

## 2019-08-27 LAB — GLUCOSE, CAPILLARY: Glucose-Capillary: 105 mg/dL — ABNORMAL HIGH (ref 70–99)

## 2019-08-27 MED ORDER — SODIUM CHLORIDE 0.9 % IV SOLN
INTRAVENOUS | Status: DC
  Administered 2019-08-27 – 2019-08-30 (×5): via INTRAVENOUS

## 2019-08-27 MED ORDER — INFLUENZA VAC SPLIT QUAD 0.5 ML IM SUSY
0.5000 mL | PREFILLED_SYRINGE | INTRAMUSCULAR | Status: DC
  Filled 2019-08-27: qty 0.5

## 2019-08-27 MED ORDER — HYDROMORPHONE HCL 1 MG/ML IJ SOLN
0.5000 mg | INTRAMUSCULAR | Status: DC | PRN
  Administered 2019-08-27 – 2019-08-28 (×3): 0.5 mg via INTRAVENOUS
  Administered 2019-08-29: 1 mg via INTRAVENOUS
  Filled 2019-08-27 (×5): qty 1
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
## 2019-08-27 NOTE — Progress Notes (Signed)
Pt self extubation at 0820 while in bilateral wrist restraints. 4L nasal canula placed on pt, sats at 99%. Pt following commands and responding. Refuses to cough or talk. Lung sounds clear bilaterally, Will continue to monitor.

## 2019-08-27 NOTE — Procedures (Signed)
Extubation Procedure Note  Patient Details:   Name: Steven Norman DOB: [birth year] MRN: [id number]   Airway Documentation:    Vent end date: 08/27/19 Vent end time: 0845   Evaluation  O2 sats: stable throughout Complications: No apparent complications Patient did tolerate procedure well. Bilateral Breath Sounds: Clear  Patient self extubation.  Patient able to talk: Yes  [name] 08/27/2019, 9:01 AM
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
## 2019-08-27 NOTE — Progress Notes (Signed)
Patient transported on vent from 4N-23 to CT and back without complication.
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
## 2019-08-28 ENCOUNTER — Inpatient Hospital Stay (HOSPITAL_COMMUNITY): Payer: Self-pay

## 2019-08-28 LAB — BASIC METABOLIC PANEL
Anion gap: 12 (ref 5–15)
BUN: 6 mg/dL (ref 6–20)
CO2: 20 mmol/L — ABNORMAL LOW (ref 22–32)
Calcium: 8.8 mg/dL — ABNORMAL LOW (ref 8.9–10.3)
Chloride: 104 mmol/L (ref 98–111)
Creatinine, Ser: 0.73 mg/dL (ref 0.61–1.24)
GFR calc Af Amer: >60 mL/min (ref >60)
GFR calc non Af Amer: >60 mL/min (ref >60)
Glucose, Bld: 102 mg/dL — ABNORMAL HIGH (ref 70–99)
Potassium: 4 mmol/L (ref 3.5–5.1)
Sodium: 136 mmol/L (ref 135–145)

## 2019-08-28 LAB — CBC
HCT: 38.9 % — ABNORMAL LOW (ref 39.0–52.0)
Hemoglobin: 12.9 g/dL — ABNORMAL LOW (ref 13.0–17.0)
MCH: 34.7 pg — ABNORMAL HIGH (ref 26.0–34.0)
MCHC: 33.2 g/dL (ref 30.0–36.0)
MCV: 104.6 fL — ABNORMAL HIGH (ref 80.0–100.0)
Platelets: 149 10*3/uL — ABNORMAL LOW (ref 150–400)
RBC: 3.72 MIL/uL — ABNORMAL LOW (ref 4.22–5.81)
RDW: 11.9 % (ref 11.5–15.5)
WBC: 17.4 10*3/uL — ABNORMAL HIGH (ref 4.0–10.5)
nRBC: 0 % (ref 0.0–0.2)

## 2019-08-28 MED ORDER — ACETAMINOPHEN 325 MG PO TABS
650.0000 mg | ORAL_TABLET | Freq: Four times a day (QID) | ORAL | Status: DC
  Administered 2019-08-29 – 2019-09-01 (×4): 650 mg via ORAL
  Filled 2019-08-28 (×10): qty 2

## 2019-08-28 MED ORDER — OXYCODONE HCL 5 MG PO TABS
5.0000 mg | ORAL_TABLET | ORAL | Status: DC | PRN
  Administered 2019-08-29 – 2019-09-01 (×6): 10 mg via ORAL
  Filled 2019-08-28 (×9): qty 2

## 2019-08-28 NOTE — Progress Notes (Signed)
Rehab Admissions Coordinator Note:  Per PT and OT recommendation, this patient was screened by [name] for appropriateness for an Inpatient Acute Rehab Consult.  At this time, we are recommending Inpatient Rehab consult. AC will contact MD to request order.   [name] 08/28/2019, 4:29 PM  I can be reached at [phone].
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
## 2019-08-29 LAB — CBC
HCT: 37.2 % — ABNORMAL LOW (ref 39.0–52.0)
Hemoglobin: 13.1 g/dL (ref 13.0–17.0)
MCH: 35.5 pg — ABNORMAL HIGH (ref 26.0–34.0)
MCHC: 35.2 g/dL (ref 30.0–36.0)
MCV: 100.8 fL — ABNORMAL HIGH (ref 80.0–100.0)
Platelets: 161 10*3/uL (ref 150–400)
RBC: 3.69 MIL/uL — ABNORMAL LOW (ref 4.22–5.81)
RDW: 11.4 % — ABNORMAL LOW (ref 11.5–15.5)
WBC: 14.4 10*3/uL — ABNORMAL HIGH (ref 4.0–10.5)
nRBC: 0 % (ref 0.0–0.2)

## 2019-08-29 LAB — BASIC METABOLIC PANEL
Anion gap: 10 (ref 5–15)
BUN: 10 mg/dL (ref 6–20)
CO2: 24 mmol/L (ref 22–32)
Calcium: 8.3 mg/dL — ABNORMAL LOW (ref 8.9–10.3)
Chloride: 104 mmol/L (ref 98–111)
Creatinine, Ser: 0.82 mg/dL (ref 0.61–1.24)
GFR calc Af Amer: >60 mL/min (ref >60)
GFR calc non Af Amer: >60 mL/min (ref >60)
Glucose, Bld: 86 mg/dL (ref 70–99)
Potassium: 3.6 mmol/L (ref 3.5–5.1)
Sodium: 138 mmol/L (ref 135–145)
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
## 2019-08-29 NOTE — Progress Notes (Signed)
Inpatient Rehab Admissions:  Inpatient Rehab Consult received.  I met with patient and his mother at the bedside for rehabilitation assessment and to discuss goals and expectations of an inpatient rehab admission.  Pt on the phone and does not engage in conversation re: rehab.  Mother open to CIR and states she will be able to provide 24/7 assist at discharge. Will continue to follow for timing of possible admission pending medical readiness and bed availability.   Signed: Shann Medal, PT, DPT Admissions Coordinator [phone] 08/29/19  2:55 PM

## 2019-08-29 NOTE — Progress Notes (Signed)
PT Cancellation Note  Patient Details Name: Steven Norman MRN: [id number] DOB: 13-Nov-1996   Cancelled Treatment:    Reason Eval/Treat Not Completed: Fatigue/lethargy limiting ability to participate; attempted twice to see patient, sleeping both times and not agreeable to walk unless he is "going home."  Reports walking to bathroom on his own, but tech from day shift gone so could not confirm.  Will attempt again another day.    Reginia Naas 08/29/2019, 4:51 PM Magda Kiel, [place] [phone] 08/29/2019

## 2019-08-30 MED ORDER — METHOCARBAMOL 500 MG PO TABS
500.0000 mg | ORAL_TABLET | Freq: Three times a day (TID) | ORAL | Status: DC
  Administered 2019-08-31: 500 mg via ORAL
  Filled 2019-08-30 (×3): qty 1
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
## 2019-08-31 ENCOUNTER — Inpatient Hospital Stay (HOSPITAL_COMMUNITY): Payer: Self-pay

## 2019-08-31 LAB — BASIC METABOLIC PANEL
Anion gap: 12 (ref 5–15)
BUN: 6 mg/dL (ref 6–20)
CO2: 27 mmol/L (ref 22–32)
Calcium: 8.6 mg/dL — ABNORMAL LOW (ref 8.9–10.3)
Chloride: 100 mmol/L (ref 98–111)
Creatinine, Ser: 0.86 mg/dL (ref 0.61–1.24)
GFR calc Af Amer: >60 mL/min (ref >60)
GFR calc non Af Amer: >60 mL/min (ref >60)
Glucose, Bld: 98 mg/dL (ref 70–99)
Potassium: 3 mmol/L — ABNORMAL LOW (ref 3.5–5.1)
Sodium: 139 mmol/L (ref 135–145)

## 2019-08-31 LAB — CBC
HCT: 37.9 % — ABNORMAL LOW (ref 39.0–52.0)
Hemoglobin: 12.7 g/dL — ABNORMAL LOW (ref 13.0–17.0)
MCH: 33.8 pg (ref 26.0–34.0)
MCHC: 33.5 g/dL (ref 30.0–36.0)
MCV: 100.8 fL — ABNORMAL HIGH (ref 80.0–100.0)
Platelets: 228 10*3/uL (ref 150–400)
RBC: 3.76 MIL/uL — ABNORMAL LOW (ref 4.22–5.81)
RDW: 11.5 % (ref 11.5–15.5)
WBC: 10.5 10*3/uL (ref 4.0–10.5)
nRBC: 0 % (ref 0.0–0.2)

## 2019-08-31 MED ORDER — ENOXAPARIN SODIUM 40 MG/0.4ML ~~LOC~~ SOLN
40.0000 mg | SUBCUTANEOUS | Status: DC
  Filled 2019-08-31: qty 0.4
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
## 2019-09-01 MED ORDER — METHOCARBAMOL 500 MG PO TABS: 500.0000 mg | ORAL_TABLET | Freq: Three times a day (TID) | ORAL | 0 refills | Status: AC

## 2019-09-01 MED ORDER — ACETAMINOPHEN 325 MG PO TABS: 650.0000 mg | ORAL_TABLET | Freq: Four times a day (QID) | ORAL | Status: AC

## 2019-09-01 MED ORDER — OXYCODONE HCL 5 MG PO TABS: 5.0000 mg | ORAL_TABLET | ORAL | 0 refills | Status: AC | PRN

## 2019-09-01 NOTE — Progress Notes (Addendum)
Pt requesting to leave AMA. Pt alert and oriented X4 upon assessment. Oncall paged. On call to floor to speak with pt. Pt to be discharged. Oncall allowed gf up to room to allow RN to teach dressing change. Discharge paperwork reviewed with pt and gf and all questions answered. RN demonstrated wet to dry dressing change to pt and gf prior to discharge. Prescriptions electronically faxed to pharmacy. Pt escorted out to vehicle with all belongings via wheelchair.

## 2019-09-01 NOTE — Discharge Instructions (Signed)
Wet gauze with normal saline.  Wring this out as much as possible and place in midline wound.  Cover wound and moistened gauze with dry gauze and tape.  Please change this twice a day.  Remove packing and get in shower.  Replace packing once out of the shower.  [facility], [place] [phone]  OPEN ABDOMINAL SURGERY: POST OP INSTRUCTIONS  Always review your discharge instruction sheet given to you by the facility where your surgery was performed.  IF YOU HAVE DISABILITY OR FAMILY LEAVE FORMS, YOU MUST BRING THEM TO THE OFFICE FOR PROCESSING.  PLEASE DO NOT GIVE THEM TO YOUR DOCTOR.  1. A prescription for pain medication may be given to you upon discharge.  Take your pain medication as prescribed, if needed.  If narcotic pain medicine is not needed, then you may take acetaminophen (Tylenol) or ibuprofen (Advil) as needed. 2. Take your usually prescribed medications unless otherwise directed. 3. If you need a refill on your pain medication, please contact your pharmacy. They will contact our office to request authorization.  Prescriptions will not be filled after 5pm or on week-ends. 4. You should follow a light diet the first few days after arrival home, such as soup and crackers, pudding, etc.unless your doctor has advised otherwise. A high-fiber, low fat diet can be resumed as tolerated.   Be sure to include lots of fluids daily. Most patients will experience some swelling and bruising on the chest and neck area.  Ice packs will help.  Swelling and bruising can take several days to resolve 5. Most patients will experience some swelling and bruising in the area of the incision. Ice pack will help. Swelling and bruising can take several days to resolve..  6. It is common to experience some constipation if taking pain medication after surgery.  Increasing fluid intake and taking a stool softener will usually help or prevent this problem from occurring.  A mild laxative (Milk of  Magnesia or Miralax) should be taken according to package directions if there are no bowel movements after 48 hours. 7.  You may have steri-strips (small skin tapes) in place directly over the incision.  These strips should be left on the skin for 7-10 days.  If your surgeon used skin glue on the incision, you may shower in 24 hours.  The glue will flake off over the next 2-3 weeks.  Any sutures or staples will be removed at the office during your follow-up visit. You may find that a light gauze bandage over your incision may keep your staples from being rubbed or pulled. You may shower and replace the bandage daily. 8. ACTIVITIES:  You may resume regular (light) daily activities beginning the next day--such as daily self-care, walking, climbing stairs--gradually increasing activities as tolerated.  You may have sexual intercourse when it is comfortable.  Refrain from any heavy lifting or straining until approved by your doctor. a. You may drive when you no longer are taking prescription pain medication, you can comfortably wear a seatbelt, and you can safely maneuver your car and apply brakes b. Return to Work: ___________________________________ 78. You should see your doctor in the office for a follow-up appointment approximately two weeks after your surgery.  Make sure that you call for this appointment within a day or two after you arrive home to insure a convenient appointment time. OTHER INSTRUCTIONS:  _____________________________________________________________ _____________________________________________________________  WHEN TO CALL YOUR DOCTOR: 1. Fever over 101.0 2. Inability to  urinate 3. Nausea and/or vomiting 4. Extreme swelling or bruising 5. Continued bleeding from incision. 6. Increased pain, redness, or drainage from the incision. 7. Difficulty swallowing or breathing 8. Muscle cramping or spasms. 9. Numbness or tingling in hands or feet or around lips.  The clinic staff is  available to answer your questions during regular business hours.  Please dont hesitate to call and ask to speak to one of the nurses if you have concerns.  For further questions, please visit [email or web address]
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
## 2019-09-02 ENCOUNTER — Encounter: Payer: Self-pay | Admitting: Family Medicine

## 2019-09-11 ENCOUNTER — Other Ambulatory Visit (HOSPITAL_COMMUNITY): Payer: Self-pay | Admitting: Neurological Surgery

## 2019-09-11 ENCOUNTER — Other Ambulatory Visit: Payer: Self-pay | Admitting: Neurological Surgery

## 2019-09-11 DIAGNOSIS — S02109A Fracture of base of skull, unspecified side, initial encounter for closed fracture: Secondary | ICD-10-CM

## 2019-09-18 ENCOUNTER — Encounter: Payer: Self-pay | Admitting: Physical Medicine and Rehabilitation

## 2019-10-18 ENCOUNTER — Other Ambulatory Visit: Payer: Self-pay

## 2019-10-18 ENCOUNTER — Encounter: Payer: Self-pay | Admitting: Physical Medicine and Rehabilitation

## 2019-10-18 ENCOUNTER — Encounter: Payer: Self-pay | Attending: Physical Medicine and Rehabilitation | Admitting: Physical Medicine and Rehabilitation

## 2019-10-18 VITALS — BP 111/72 | HR 89 | Temp 97.5°F | Ht 66.5 in | Wt 122.0 lb

## 2019-10-18 DIAGNOSIS — S069XAA Unspecified intracranial injury with loss of consciousness status unknown, initial encounter: Secondary | ICD-10-CM | POA: Insufficient documentation

## 2019-10-18 DIAGNOSIS — R109 Unspecified abdominal pain: Secondary | ICD-10-CM | POA: Insufficient documentation

## 2019-10-18 DIAGNOSIS — Z809 Family history of malignant neoplasm, unspecified: Secondary | ICD-10-CM | POA: Insufficient documentation

## 2019-10-18 DIAGNOSIS — R451 Restlessness and agitation: Secondary | ICD-10-CM | POA: Insufficient documentation

## 2019-10-18 DIAGNOSIS — W3400XA Accidental discharge from unspecified firearms or gun, initial encounter: Secondary | ICD-10-CM

## 2019-10-18 DIAGNOSIS — Z87891 Personal history of nicotine dependence: Secondary | ICD-10-CM | POA: Insufficient documentation

## 2019-10-18 DIAGNOSIS — G4459 Other complicated headache syndrome: Secondary | ICD-10-CM

## 2019-10-18 DIAGNOSIS — G8194 Hemiplegia, unspecified affecting left nondominant side: Secondary | ICD-10-CM | POA: Insufficient documentation

## 2019-10-18 DIAGNOSIS — Z79899 Other long term (current) drug therapy: Secondary | ICD-10-CM | POA: Insufficient documentation

## 2019-10-18 DIAGNOSIS — R413 Other amnesia: Secondary | ICD-10-CM | POA: Insufficient documentation

## 2019-10-18 DIAGNOSIS — R1013 Epigastric pain: Secondary | ICD-10-CM

## 2019-10-18 DIAGNOSIS — R519 Headache, unspecified: Secondary | ICD-10-CM | POA: Insufficient documentation

## 2019-10-18 DIAGNOSIS — S069X5S Unspecified intracranial injury with loss of consciousness greater than 24 hours with return to pre-existing conscious level, sequela: Secondary | ICD-10-CM | POA: Insufficient documentation

## 2019-10-18 DIAGNOSIS — S069X9A Unspecified intracranial injury with loss of consciousness of unspecified duration, initial encounter: Secondary | ICD-10-CM | POA: Insufficient documentation

## 2019-10-18 DIAGNOSIS — G8929 Other chronic pain: Secondary | ICD-10-CM | POA: Insufficient documentation

## 2019-10-18 MED ORDER — GABAPENTIN 300 MG PO CAPS: 600.0000 mg | ORAL_CAPSULE | Freq: Two times a day (BID) | ORAL | 2 refills | Status: AC

## 2019-10-18 MED ORDER — AMITRIPTYLINE HCL 25 MG PO TABS: 100.0000 mg | ORAL_TABLET | Freq: Every day | ORAL | 5 refills | Status: AC

## 2019-10-18 MED ORDER — LIDOCAINE 5 % EX OINT: 1.0000 "application " | TOPICAL_OINTMENT | Freq: Four times a day (QID) | CUTANEOUS | 5 refills | Status: AC | PRN

## 2019-10-18 MED FILL — AMITRIPTYLINE HCL 25 MG TAB: 25 | 30 days supply | Qty: 120 | Fill #0

## 2019-10-18 MED FILL — LIDOCAINE 5 % OINT: 5 | 15 days supply | Qty: 35 | Fill #0

## 2019-10-18 MED FILL — GABAPENTIN 300 MG CAPSULE: 300 | 22 days supply | Qty: 90 | Fill #0
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
## 2019-12-02 ENCOUNTER — Encounter: Payer: Self-pay | Admitting: Physical Medicine and Rehabilitation

## 2019-12-23 ENCOUNTER — Encounter: Payer: Self-pay | Attending: Physical Medicine and Rehabilitation | Admitting: Physical Medicine and Rehabilitation

## 2019-12-23 DIAGNOSIS — S069X5S Unspecified intracranial injury with loss of consciousness greater than 24 hours with return to pre-existing conscious level, sequela: Secondary | ICD-10-CM | POA: Insufficient documentation

## 2019-12-23 DIAGNOSIS — Z87891 Personal history of nicotine dependence: Secondary | ICD-10-CM | POA: Insufficient documentation

## 2019-12-23 DIAGNOSIS — R451 Restlessness and agitation: Secondary | ICD-10-CM | POA: Insufficient documentation

## 2019-12-23 DIAGNOSIS — R413 Other amnesia: Secondary | ICD-10-CM | POA: Insufficient documentation

## 2019-12-23 DIAGNOSIS — G8194 Hemiplegia, unspecified affecting left nondominant side: Secondary | ICD-10-CM | POA: Insufficient documentation

## 2019-12-23 DIAGNOSIS — R109 Unspecified abdominal pain: Secondary | ICD-10-CM | POA: Insufficient documentation

## 2019-12-23 DIAGNOSIS — R519 Headache, unspecified: Secondary | ICD-10-CM | POA: Insufficient documentation

## 2019-12-23 DIAGNOSIS — Z79899 Other long term (current) drug therapy: Secondary | ICD-10-CM | POA: Insufficient documentation

## 2019-12-23 DIAGNOSIS — Z809 Family history of malignant neoplasm, unspecified: Secondary | ICD-10-CM | POA: Insufficient documentation

## 2019-12-23 DIAGNOSIS — G8929 Other chronic pain: Secondary | ICD-10-CM | POA: Insufficient documentation

## 2020-03-09 IMAGING — CT CT HEAD W/O CM
4 series · 17 of 47 positions shown, 19 images · non-contrast
Comparison: 08/27/2019

CLINICAL DATA: Head trauma follow-up

EXAM:
CT HEAD WITHOUT CONTRAST
TECHNIQUE: Contiguous axial images were obtained from the base of the skull
through the vertex without intravenous contrast.

[Series 3: head wo · axial · 0.41mm/px · z∈[-126,-6]mm · 7 of 33 slices shown, 9 images]
[im 5/33  brain]
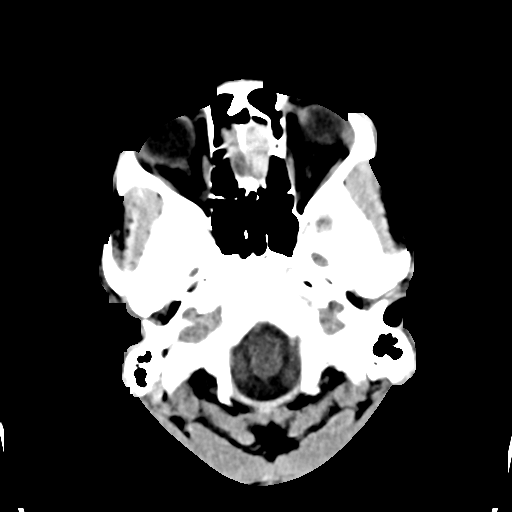
[im 5/33  bone]
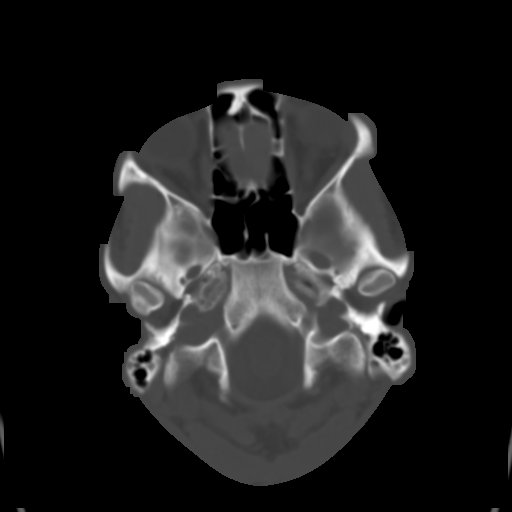
[im 9/33  brain]
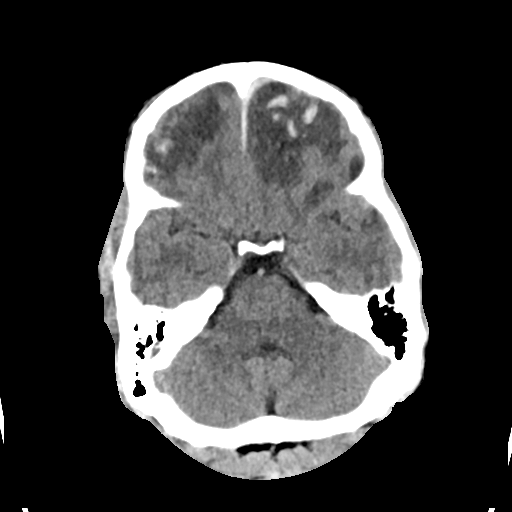
[im 13/33  brain]
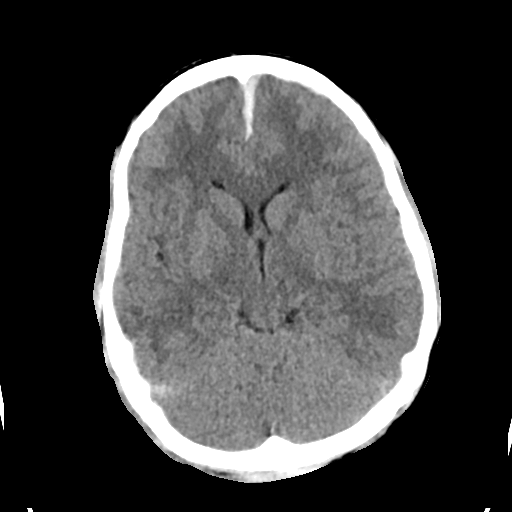
[im 17/33  brain]
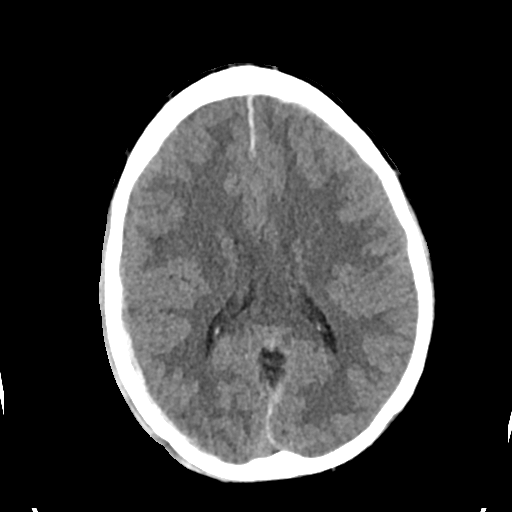
[im 21/33  brain]
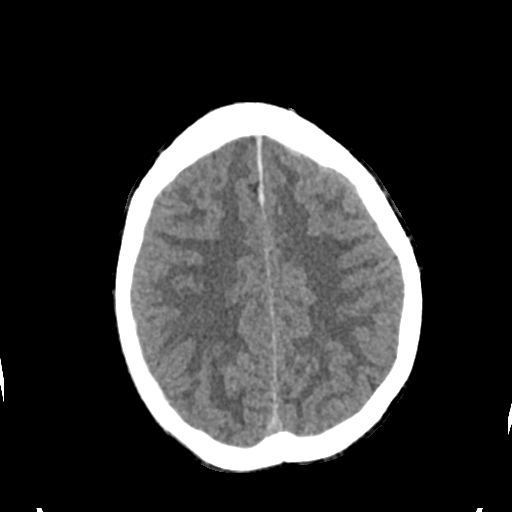
[im 21/33  bone]
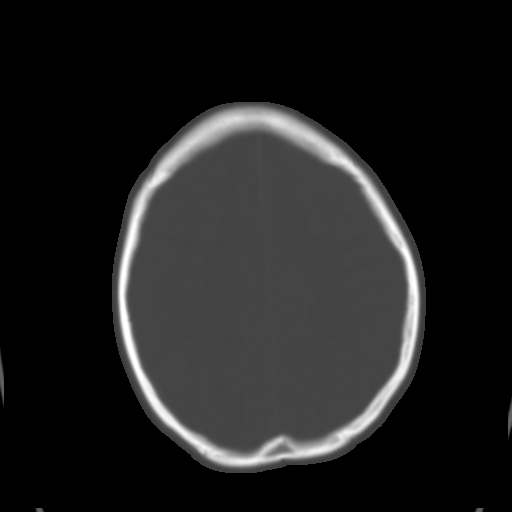
[im 25/33  brain]
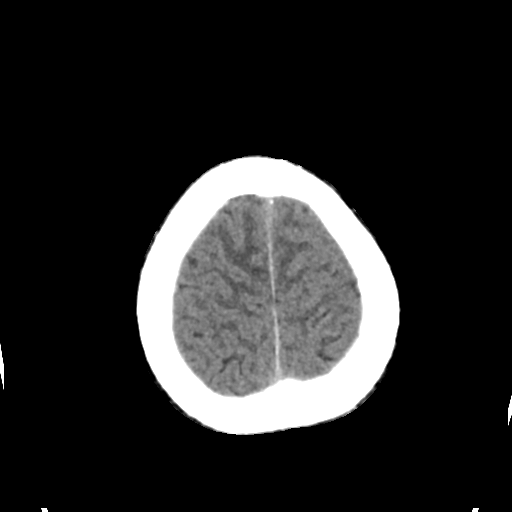
[im 29/33  brain]
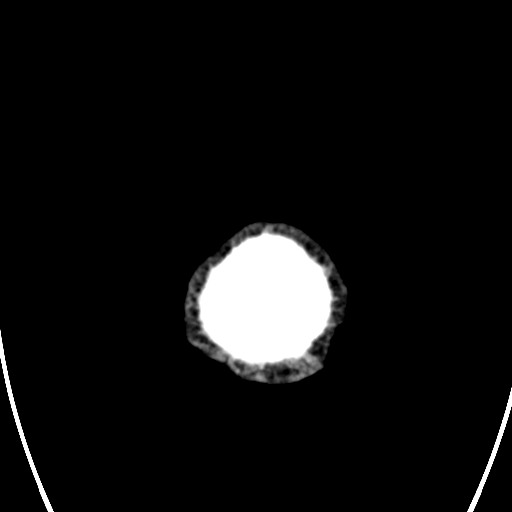

[Series 4: head bone · axial · 0.41mm/px · z∈[-130,-74]mm · 4 of 81 slices shown]
[im 9/81  bone]
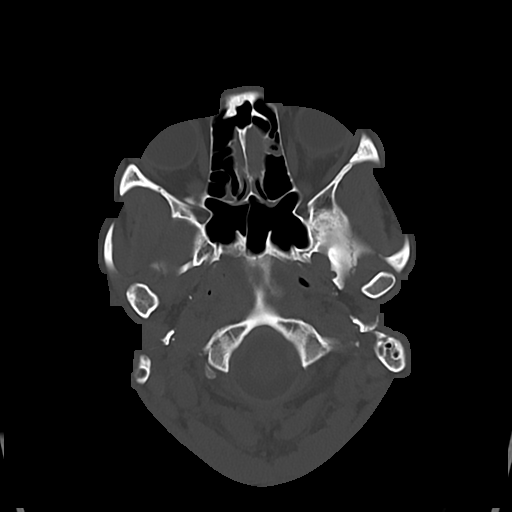
[im 17/81  bone]
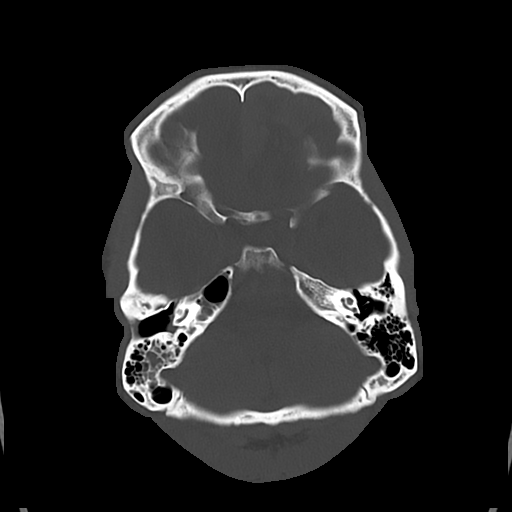
[im 25/81  bone]
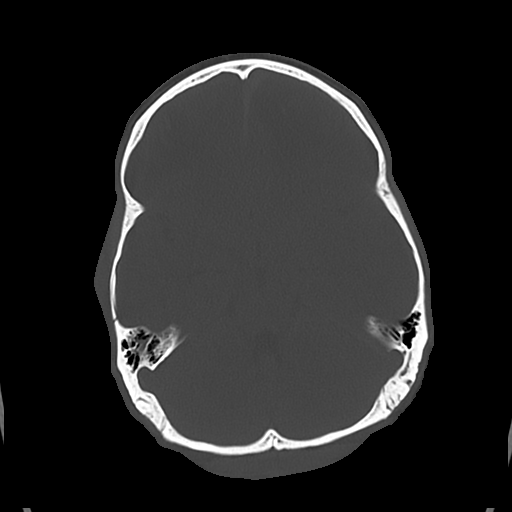
[im 37/81  bone]
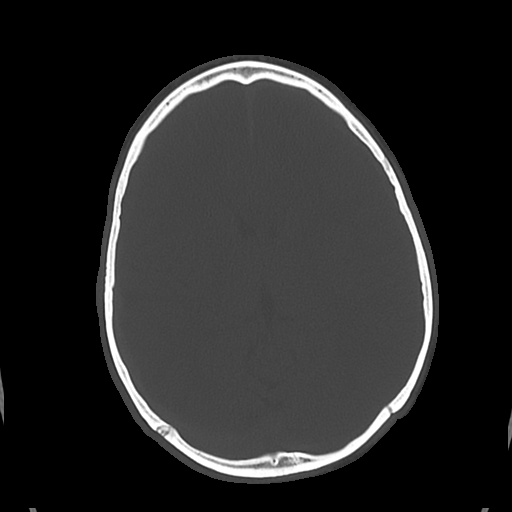

[Series 5: cor soft · coronal · 0.33mm/px · 3 of 72 slices shown]
[im 24/72  brain]
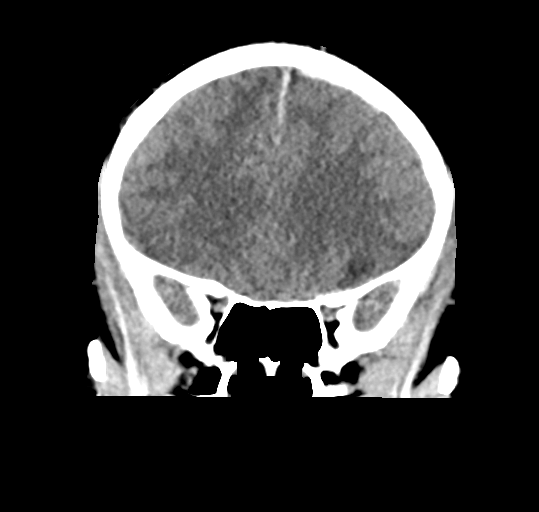
[im 32/72  brain]
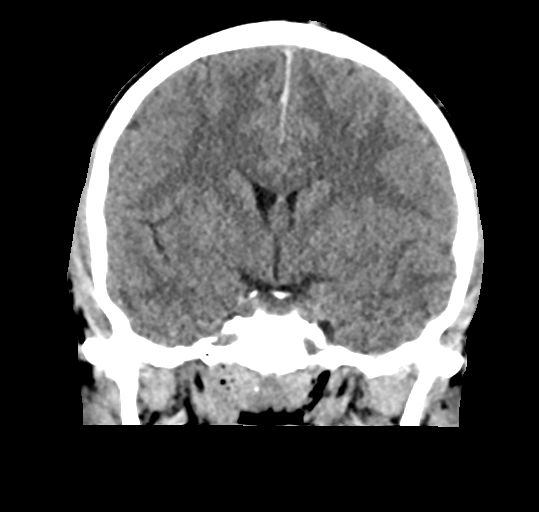
[im 40/72  brain]
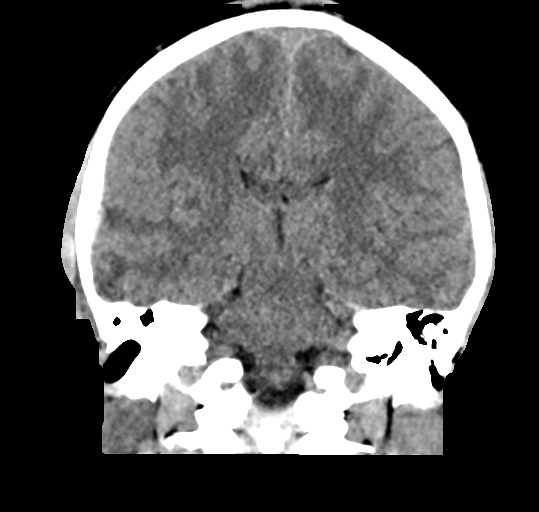

[Series 6: sag soft · sagittal · 0.43mm/px · 3 of 60 slices shown]
[im 20/60  brain]
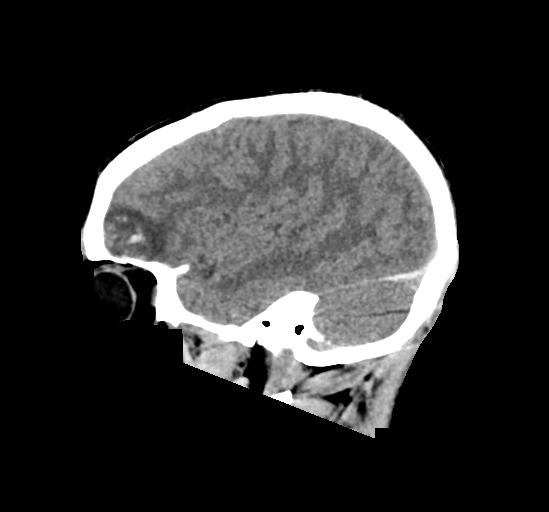
[im 30/60  brain]
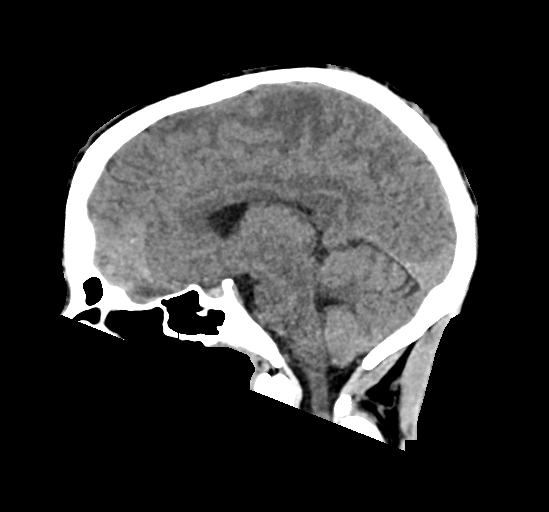
[im 40/60  brain]
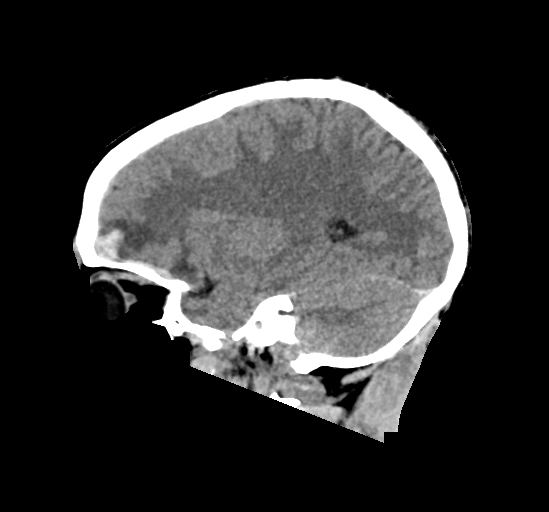

[17 of 47 positions shown; findings below may reference images not displayed]

FINDINGS: Brain: There is bilateral inferior frontal intraparenchymal
hematoma, unchanged. Subdural blood along the inferior left falx
cerebri and posterior right convexity is unchanged. No new site of
hemorrhage. The edema surrounding the locations of intraparenchymal
hemorrhage has worsened. No midline shift, other mass effect or
hydrocephalus.

Vascular: No abnormal hyperdensity of the major intracranial
arteries or dural venous sinuses. No intracranial atherosclerosis.

Skull: Unchanged appearance of nondisplaced right temporal bone
fracture

Sinuses/Orbits: Right mastoid effusion. Paranasal sinuses are clear.
The orbits are normal.
IMPRESSION: 1. Unchanged appearance of multifocal intra- and extra-axial
hemorrhage.
2. Increased edema surrounding the bifrontal intraparenchymal
hemorrhage.
3. Unchanged appearance of nondisplaced right temporal bone
fracture.

## 2022-04-03 ENCOUNTER — Encounter (HOSPITAL_BASED_OUTPATIENT_CLINIC_OR_DEPARTMENT_OTHER): Payer: Self-pay | Admitting: Emergency Medicine

## 2022-04-03 ENCOUNTER — Emergency Department (HOSPITAL_BASED_OUTPATIENT_CLINIC_OR_DEPARTMENT_OTHER)
Admission: EM | Admit: 2022-04-03 | Discharge: 2022-04-03 | Disposition: A | Discharge status: Home / Self Care | Payer: Self-pay | Attending: Emergency Medicine | Admitting: Emergency Medicine

## 2022-04-03 ENCOUNTER — Other Ambulatory Visit: Payer: Self-pay

## 2022-04-03 DIAGNOSIS — A64 Unspecified sexually transmitted disease: Secondary | ICD-10-CM

## 2022-04-03 DIAGNOSIS — R369 Urethral discharge, unspecified: Secondary | ICD-10-CM | POA: Insufficient documentation

## 2022-04-03 DIAGNOSIS — Z87891 Personal history of nicotine dependence: Secondary | ICD-10-CM | POA: Insufficient documentation

## 2022-04-03 LAB — URINALYSIS, ROUTINE W REFLEX MICROSCOPIC
Bilirubin Urine: NEGATIVE
Glucose, UA: NEGATIVE mg/dL
Ketones, ur: NEGATIVE mg/dL
Nitrite: NEGATIVE
Specific Gravity, Urine: 1.02 (ref 1.005–1.030)
WBC, UA: >50 WBC/hpf — ABNORMAL HIGH (ref 0–5)
pH: 6.5 (ref 5.0–8.0)

## 2022-04-03 MED ORDER — METRONIDAZOLE 500 MG PO TABS
2000.0000 mg | ORAL_TABLET | Freq: Once | ORAL | Status: AC
  Administered 2022-04-03: 2000 mg via ORAL
  Filled 2022-04-03: qty 4

## 2022-04-03 MED ORDER — DOXYCYCLINE HYCLATE 100 MG PO TABS: 100.0000 mg | ORAL_TABLET | Freq: Two times a day (BID) | ORAL | 0 refills | Status: AC

## 2022-04-03 MED ORDER — CEFTRIAXONE SODIUM 500 MG IJ SOLR
500.0000 mg | Freq: Once | INTRAMUSCULAR | Status: AC
  Administered 2022-04-03: 500 mg via INTRAMUSCULAR
  Filled 2022-04-03: qty 500

## 2022-04-03 MED ORDER — LIDOCAINE HCL (PF) 1 % IJ SOLN
1.0000 mL | Freq: Once | INTRAMUSCULAR | Status: AC
  Administered 2022-04-03: 1 mL
  Filled 2022-04-03: qty 5

## 2022-04-03 NOTE — ED Notes (Signed)
Patient given discharge instructions. Questions were answered. Patient verbalized understanding of discharge instructions and care at home.  

## 2022-04-03 NOTE — ED Triage Notes (Signed)
Pt reports his penis hurts when he has an erection, had some bloody drainage 2 days ago when he tried to force an erection,per patient.  ?
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
## 2022-04-03 NOTE — Discharge Instructions (Addendum)
It was a pleasure caring for you today in the emergency department. ? ?Please return to the emergency department for any worsening or worrisome symptoms. ? ?Please do not resume sexual activity until after you complete antibiotics.  ?

## 2022-04-05 LAB — GC/CHLAMYDIA PROBE AMP (~~LOC~~) NOT AT ARMC
Chlamydia: POSITIVE — AB
Comment: NEGATIVE
Comment: NORMAL
Neisseria Gonorrhea: POSITIVE — AB

## 2024-09-15 ENCOUNTER — Other Ambulatory Visit: Payer: Self-pay

## 2024-09-15 ENCOUNTER — Emergency Department (HOSPITAL_COMMUNITY): Payer: Self-pay

## 2024-09-15 ENCOUNTER — Emergency Department (HOSPITAL_COMMUNITY)
Admission: EM | Admit: 2024-09-15 | Discharge: 2024-09-15 | Disposition: A | Discharge status: Home / Self Care | Payer: Self-pay | Attending: Emergency Medicine | Admitting: Emergency Medicine

## 2024-09-15 ENCOUNTER — Encounter (HOSPITAL_COMMUNITY): Payer: Self-pay

## 2024-09-15 DIAGNOSIS — K029 Dental caries, unspecified: Secondary | ICD-10-CM | POA: Insufficient documentation

## 2024-09-15 DIAGNOSIS — M545 Low back pain, unspecified: Secondary | ICD-10-CM | POA: Insufficient documentation

## 2024-09-15 DIAGNOSIS — K0889 Other specified disorders of teeth and supporting structures: Secondary | ICD-10-CM

## 2024-09-15 MED ORDER — OXYCODONE-ACETAMINOPHEN 5-325 MG PO TABS
1.0000 | ORAL_TABLET | ORAL | Status: DC | PRN
  Administered 2024-09-15: 1 via ORAL
  Filled 2024-09-15: qty 1

## 2024-09-15 MED ORDER — AMOXICILLIN-POT CLAVULANATE 875-125 MG PO TABS: 1.0000 | ORAL_TABLET | Freq: Two times a day (BID) | ORAL | 0 refills | Status: AC

## 2024-09-15 MED ORDER — OXYCODONE-ACETAMINOPHEN 5-325 MG PO TABS: 1.0000 | ORAL_TABLET | Freq: Four times a day (QID) | ORAL | 0 refills | Status: AC | PRN

## 2024-09-15 MED ORDER — KETOROLAC TROMETHAMINE 30 MG/ML IJ SOLN
30.0000 mg | Freq: Once | INTRAMUSCULAR | Status: DC
  Filled 2024-09-15: qty 1

## 2024-09-15 MED ORDER — AMOXICILLIN 500 MG PO CAPS
500.0000 mg | ORAL_CAPSULE | Freq: Once | ORAL | Status: AC
  Administered 2024-09-15: 500 mg via ORAL
  Filled 2024-09-15: qty 1

## 2024-09-15 MED ORDER — IBUPROFEN 400 MG PO TABS
400.0000 mg | ORAL_TABLET | Freq: Once | ORAL | Status: AC | PRN
  Administered 2024-09-15: 400 mg via ORAL
  Filled 2024-09-15: qty 1

## 2024-09-15 NOTE — ED Triage Notes (Signed)
 Patient is homeless and complains of left lower dental pain with extensive dental decay.  Also complains of lower back pain.

## 2024-09-15 NOTE — Discharge Instructions (Signed)
 It was a pleasure taking care of you here today  I written you for antibiotics to help with your dental infection as well as pain medicine for your dental infection and your lower back pain.  Make sure to follow-up outpatient  Return for any new or worsening symptoms
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
## 2024-09-16 ENCOUNTER — Telehealth (HOSPITAL_COMMUNITY): Payer: Self-pay

## 2024-09-16 NOTE — Telephone Encounter (Signed)
 Pt was seen at the [facility] ED yesterday. Called today for a work note.
